# Patient Record
Sex: Female | Born: 1965 | Race: White | Hispanic: No | Smoking: Never smoker
Health system: Southern US, Community
[De-identification: ages and names within clinical notes are randomized; demographics above are authoritative.]

## PROBLEM LIST (undated history)

## (undated) DIAGNOSIS — Z9889 Other specified postprocedural states: Secondary | ICD-10-CM

## (undated) DIAGNOSIS — G43909 Migraine, unspecified, not intractable, without status migrainosus: Secondary | ICD-10-CM

## (undated) DIAGNOSIS — K760 Fatty (change of) liver, not elsewhere classified: Secondary | ICD-10-CM

## (undated) DIAGNOSIS — E2839 Other primary ovarian failure: Secondary | ICD-10-CM

## (undated) DIAGNOSIS — J40 Bronchitis, not specified as acute or chronic: Secondary | ICD-10-CM

## (undated) DIAGNOSIS — F419 Anxiety disorder, unspecified: Secondary | ICD-10-CM

## (undated) DIAGNOSIS — R112 Nausea with vomiting, unspecified: Secondary | ICD-10-CM

## (undated) DIAGNOSIS — F988 Other specified behavioral and emotional disorders with onset usually occurring in childhood and adolescence: Secondary | ICD-10-CM

## (undated) DIAGNOSIS — F32A Depression, unspecified: Secondary | ICD-10-CM

## (undated) DIAGNOSIS — I10 Essential (primary) hypertension: Secondary | ICD-10-CM

## (undated) DIAGNOSIS — F329 Major depressive disorder, single episode, unspecified: Secondary | ICD-10-CM

## (undated) DIAGNOSIS — K219 Gastro-esophageal reflux disease without esophagitis: Secondary | ICD-10-CM

## (undated) DIAGNOSIS — K589 Irritable bowel syndrome without diarrhea: Secondary | ICD-10-CM

## (undated) HISTORY — DX: Fatty (change of) liver, not elsewhere classified: K76.0

## (undated) HISTORY — DX: Migraine, unspecified, not intractable, without status migrainosus: G43.909

## (undated) HISTORY — PX: OTHER SURGICAL HISTORY: SHX169

## (undated) HISTORY — DX: Irritable bowel syndrome, unspecified: K58.9

## (undated) HISTORY — DX: Gastro-esophageal reflux disease without esophagitis: K21.9

## (undated) HISTORY — PX: ESOPHAGOGASTRODUODENOSCOPY: SHX1529

## (undated) HISTORY — PX: ABDOMINAL HYSTERECTOMY: SHX81

## (undated) HISTORY — DX: Depression, unspecified: F32.A

## (undated) HISTORY — PX: COLONOSCOPY: SHX174

## (undated) HISTORY — PX: TUBAL LIGATION: SHX77

## (undated) HISTORY — DX: Other primary ovarian failure: E28.39

## (undated) HISTORY — DX: Other specified behavioral and emotional disorders with onset usually occurring in childhood and adolescence: F98.8

## (undated) HISTORY — PX: TOTAL ABDOMINAL HYSTERECTOMY W/ BILATERAL SALPINGOOPHORECTOMY: SHX83

## (undated) HISTORY — PX: SPINAL FUSION: SHX223

---
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
## 2000-09-23 ENCOUNTER — Emergency Department (HOSPITAL_COMMUNITY): Admission: EM | Admit: 2000-09-23 | Discharge: 2000-09-23 | Payer: Self-pay | Admitting: Emergency Medicine

## 2000-12-26 ENCOUNTER — Emergency Department (HOSPITAL_COMMUNITY): Admission: EM | Admit: 2000-12-26 | Discharge: 2000-12-26 | Payer: Self-pay | Admitting: Internal Medicine

## 2001-06-09 ENCOUNTER — Ambulatory Visit (HOSPITAL_COMMUNITY): Admission: RE | Admit: 2001-06-09 | Discharge: 2001-06-09 | Payer: Self-pay | Admitting: Family Medicine

## 2001-06-09 ENCOUNTER — Encounter: Payer: Self-pay | Admitting: Family Medicine

## 2002-02-16 ENCOUNTER — Emergency Department (HOSPITAL_COMMUNITY): Admission: EM | Admit: 2002-02-16 | Discharge: 2002-02-16 | Payer: Self-pay | Admitting: Emergency Medicine

## 2002-03-05 HISTORY — PX: COLONOSCOPY WITH ESOPHAGOGASTRODUODENOSCOPY (EGD): SHX5779

## 2002-03-27 ENCOUNTER — Encounter: Payer: Self-pay | Admitting: Family Medicine

## 2002-03-27 ENCOUNTER — Ambulatory Visit (HOSPITAL_COMMUNITY): Admission: RE | Admit: 2002-03-27 | Discharge: 2002-03-27 | Payer: Self-pay | Admitting: Family Medicine

## 2002-04-24 ENCOUNTER — Ambulatory Visit (HOSPITAL_COMMUNITY): Admission: RE | Admit: 2002-04-24 | Discharge: 2002-04-24 | Payer: Self-pay | Admitting: Internal Medicine

## 2002-12-10 ENCOUNTER — Encounter: Payer: Self-pay | Admitting: Family Medicine

## 2002-12-10 ENCOUNTER — Ambulatory Visit (HOSPITAL_COMMUNITY): Admission: RE | Admit: 2002-12-10 | Discharge: 2002-12-10 | Payer: Self-pay | Admitting: Family Medicine

## 2002-12-30 ENCOUNTER — Ambulatory Visit (HOSPITAL_COMMUNITY): Admission: RE | Admit: 2002-12-30 | Discharge: 2002-12-30 | Payer: Self-pay | Admitting: Obstetrics and Gynecology

## 2003-01-26 ENCOUNTER — Inpatient Hospital Stay (HOSPITAL_COMMUNITY): Admission: RE | Admit: 2003-01-26 | Discharge: 2003-01-27 | Payer: Self-pay | Admitting: Obstetrics & Gynecology

## 2003-08-23 ENCOUNTER — Ambulatory Visit (HOSPITAL_COMMUNITY): Admission: RE | Admit: 2003-08-23 | Discharge: 2003-08-23 | Payer: Self-pay | Admitting: Orthopedic Surgery

## 2003-09-03 ENCOUNTER — Emergency Department (HOSPITAL_COMMUNITY): Admission: EM | Admit: 2003-09-03 | Discharge: 2003-09-03 | Payer: Self-pay | Admitting: Emergency Medicine

## 2003-11-03 ENCOUNTER — Ambulatory Visit (HOSPITAL_COMMUNITY): Admission: RE | Admit: 2003-11-03 | Discharge: 2003-11-03 | Payer: Self-pay | Admitting: Orthopedic Surgery

## 2003-11-05 ENCOUNTER — Ambulatory Visit: Payer: Self-pay | Admitting: Psychology

## 2003-11-09 ENCOUNTER — Ambulatory Visit (HOSPITAL_COMMUNITY): Admission: RE | Admit: 2003-11-09 | Discharge: 2003-11-09 | Payer: Self-pay | Admitting: Internal Medicine

## 2003-12-24 ENCOUNTER — Ambulatory Visit: Payer: Self-pay | Admitting: Psychiatry

## 2004-02-18 ENCOUNTER — Ambulatory Visit: Payer: Self-pay | Admitting: Psychiatry

## 2004-05-12 ENCOUNTER — Ambulatory Visit: Payer: Self-pay | Admitting: Psychiatry

## 2004-10-02 ENCOUNTER — Ambulatory Visit (HOSPITAL_COMMUNITY): Admission: RE | Admit: 2004-10-02 | Discharge: 2004-10-02 | Payer: Self-pay | Admitting: Family Medicine

## 2004-12-01 ENCOUNTER — Ambulatory Visit (HOSPITAL_COMMUNITY): Admission: RE | Admit: 2004-12-01 | Discharge: 2004-12-01 | Payer: Self-pay | Admitting: Family Medicine

## 2005-02-05 ENCOUNTER — Encounter (HOSPITAL_COMMUNITY): Admission: RE | Admit: 2005-02-05 | Discharge: 2005-02-22 | Payer: Self-pay | Admitting: Neurosurgery

## 2005-02-27 ENCOUNTER — Encounter (HOSPITAL_COMMUNITY): Admission: RE | Admit: 2005-02-27 | Discharge: 2005-02-27 | Payer: Self-pay | Admitting: Family Medicine

## 2005-03-07 ENCOUNTER — Encounter (HOSPITAL_COMMUNITY): Admission: RE | Admit: 2005-03-07 | Discharge: 2005-04-06 | Payer: Self-pay | Admitting: Family Medicine

## 2005-03-07 ENCOUNTER — Encounter (HOSPITAL_COMMUNITY): Admission: RE | Admit: 2005-03-07 | Discharge: 2005-04-06 | Payer: Self-pay | Admitting: Neurosurgery

## 2005-03-27 ENCOUNTER — Ambulatory Visit (HOSPITAL_COMMUNITY): Admission: RE | Admit: 2005-03-27 | Discharge: 2005-03-27 | Payer: Self-pay | Admitting: Otolaryngology

## 2005-05-05 ENCOUNTER — Emergency Department (HOSPITAL_COMMUNITY): Admission: EM | Admit: 2005-05-05 | Discharge: 2005-05-05 | Payer: Self-pay | Admitting: Emergency Medicine

## 2005-07-11 ENCOUNTER — Encounter (HOSPITAL_COMMUNITY): Admission: RE | Admit: 2005-07-11 | Discharge: 2005-08-10 | Payer: Self-pay | Admitting: Neurosurgery

## 2005-08-13 ENCOUNTER — Encounter (HOSPITAL_COMMUNITY): Admission: RE | Admit: 2005-08-13 | Discharge: 2005-09-12 | Payer: Self-pay | Admitting: Neurosurgery

## 2006-03-25 ENCOUNTER — Ambulatory Visit (HOSPITAL_COMMUNITY): Admission: RE | Admit: 2006-03-25 | Discharge: 2006-03-25 | Payer: Self-pay | Admitting: Neurosurgery

## 2006-07-24 ENCOUNTER — Ambulatory Visit (HOSPITAL_COMMUNITY): Admission: RE | Admit: 2006-07-24 | Discharge: 2006-07-25 | Payer: Self-pay | Admitting: Neurosurgery

## 2006-08-29 ENCOUNTER — Encounter (HOSPITAL_COMMUNITY): Admission: RE | Admit: 2006-08-29 | Discharge: 2006-09-28 | Payer: Self-pay | Admitting: Neurosurgery

## 2006-11-08 ENCOUNTER — Other Ambulatory Visit: Admission: RE | Admit: 2006-11-08 | Discharge: 2006-11-08 | Payer: Self-pay | Admitting: Obstetrics and Gynecology

## 2006-12-10 ENCOUNTER — Ambulatory Visit (HOSPITAL_COMMUNITY): Admission: RE | Admit: 2006-12-10 | Discharge: 2006-12-10 | Payer: Self-pay | Admitting: Obstetrics & Gynecology

## 2006-12-25 ENCOUNTER — Ambulatory Visit (HOSPITAL_COMMUNITY): Admission: RE | Admit: 2006-12-25 | Discharge: 2006-12-25 | Payer: Self-pay | Admitting: Obstetrics & Gynecology

## 2006-12-27 ENCOUNTER — Ambulatory Visit (HOSPITAL_COMMUNITY): Admission: RE | Admit: 2006-12-27 | Discharge: 2006-12-27 | Payer: Self-pay | Admitting: Neurosurgery

## 2007-02-11 ENCOUNTER — Ambulatory Visit (HOSPITAL_COMMUNITY): Admission: RE | Admit: 2007-02-11 | Discharge: 2007-02-11 | Payer: Self-pay | Admitting: Family Medicine

## 2007-03-04 ENCOUNTER — Emergency Department (HOSPITAL_COMMUNITY): Admission: EM | Admit: 2007-03-04 | Discharge: 2007-03-04 | Payer: Self-pay | Admitting: Emergency Medicine

## 2007-03-06 HISTORY — PX: ABDOMINAL HYSTERECTOMY: SHX81

## 2007-04-10 ENCOUNTER — Emergency Department (HOSPITAL_COMMUNITY): Admission: EM | Admit: 2007-04-10 | Discharge: 2007-04-10 | Payer: Self-pay | Admitting: Emergency Medicine

## 2007-08-19 ENCOUNTER — Ambulatory Visit: Payer: Self-pay | Admitting: Internal Medicine

## 2007-09-09 ENCOUNTER — Ambulatory Visit (HOSPITAL_COMMUNITY): Admission: RE | Admit: 2007-09-09 | Discharge: 2007-09-09 | Payer: Self-pay | Admitting: Family Medicine

## 2007-09-30 ENCOUNTER — Ambulatory Visit (HOSPITAL_COMMUNITY): Admission: RE | Admit: 2007-09-30 | Discharge: 2007-09-30 | Payer: Self-pay | Admitting: Family Medicine

## 2007-10-04 HISTORY — PX: COLONOSCOPY: SHX174

## 2007-10-07 ENCOUNTER — Ambulatory Visit: Payer: Self-pay | Admitting: Internal Medicine

## 2007-10-15 ENCOUNTER — Ambulatory Visit: Payer: Self-pay | Admitting: Internal Medicine

## 2007-10-15 ENCOUNTER — Ambulatory Visit (HOSPITAL_COMMUNITY): Admission: RE | Admit: 2007-10-15 | Discharge: 2007-10-15 | Payer: Self-pay | Admitting: Internal Medicine

## 2007-10-20 ENCOUNTER — Ambulatory Visit (HOSPITAL_COMMUNITY): Admission: RE | Admit: 2007-10-20 | Discharge: 2007-10-20 | Payer: Self-pay | Admitting: Family Medicine

## 2008-01-07 ENCOUNTER — Ambulatory Visit (HOSPITAL_COMMUNITY): Admission: RE | Admit: 2008-01-07 | Discharge: 2008-01-07 | Payer: Self-pay | Admitting: Family Medicine

## 2008-01-22 ENCOUNTER — Encounter: Admission: RE | Admit: 2008-01-22 | Discharge: 2008-01-22 | Payer: Self-pay | Admitting: Neurosurgery

## 2008-04-30 ENCOUNTER — Emergency Department (HOSPITAL_COMMUNITY): Admission: EM | Admit: 2008-04-30 | Discharge: 2008-04-30 | Payer: Self-pay | Admitting: Emergency Medicine

## 2009-03-10 ENCOUNTER — Ambulatory Visit (HOSPITAL_COMMUNITY): Admission: RE | Admit: 2009-03-10 | Discharge: 2009-03-10 | Payer: Self-pay | Admitting: Family Medicine

## 2009-05-09 ENCOUNTER — Ambulatory Visit (HOSPITAL_COMMUNITY)
Admission: RE | Admit: 2009-05-09 | Discharge: 2009-05-09 | Payer: Self-pay | Admitting: Physical Medicine and Rehabilitation

## 2009-06-15 ENCOUNTER — Emergency Department (HOSPITAL_COMMUNITY): Admission: EM | Admit: 2009-06-15 | Discharge: 2009-06-15 | Payer: Self-pay | Admitting: Emergency Medicine

## 2009-06-21 ENCOUNTER — Encounter (HOSPITAL_COMMUNITY): Admission: RE | Admit: 2009-06-21 | Discharge: 2009-07-21 | Payer: Self-pay | Admitting: Family Medicine

## 2009-09-21 ENCOUNTER — Ambulatory Visit: Payer: Self-pay | Admitting: Vascular Surgery

## 2009-09-27 ENCOUNTER — Encounter (INDEPENDENT_AMBULATORY_CARE_PROVIDER_SITE_OTHER): Payer: Self-pay | Admitting: *Deleted

## 2009-09-27 ENCOUNTER — Encounter: Payer: Self-pay | Admitting: Gastroenterology

## 2009-09-27 ENCOUNTER — Ambulatory Visit: Payer: Self-pay | Admitting: Gastroenterology

## 2009-09-27 DIAGNOSIS — R635 Abnormal weight gain: Secondary | ICD-10-CM | POA: Insufficient documentation

## 2009-09-27 DIAGNOSIS — Z8711 Personal history of peptic ulcer disease: Secondary | ICD-10-CM | POA: Insufficient documentation

## 2009-09-27 DIAGNOSIS — K5900 Constipation, unspecified: Secondary | ICD-10-CM | POA: Insufficient documentation

## 2009-09-27 DIAGNOSIS — R1013 Epigastric pain: Secondary | ICD-10-CM | POA: Insufficient documentation

## 2009-09-27 DIAGNOSIS — K589 Irritable bowel syndrome without diarrhea: Secondary | ICD-10-CM | POA: Insufficient documentation

## 2009-09-27 DIAGNOSIS — K219 Gastro-esophageal reflux disease without esophagitis: Secondary | ICD-10-CM | POA: Insufficient documentation

## 2009-09-28 ENCOUNTER — Encounter: Payer: Self-pay | Admitting: Gastroenterology

## 2009-10-03 ENCOUNTER — Encounter: Payer: Self-pay | Admitting: Gastroenterology

## 2009-10-03 DIAGNOSIS — R799 Abnormal finding of blood chemistry, unspecified: Secondary | ICD-10-CM

## 2009-10-06 LAB — CONVERTED CEMR LAB
ALT: 26 U/L
AST: 20 U/L
Albumin: 4.4 g/dL
Alkaline Phosphatase: 58 U/L
BUN: 15 mg/dL
Basophils Absolute: 0.1 10*3/uL
Basophils Relative: 1 %
CO2: 22 meq/L
Calcium: 9 mg/dL
Chloride: 107 meq/L
Creatinine, Ser: 0.82 mg/dL
Eosinophils Absolute: 0.2 10*3/uL
Eosinophils Relative: 3 %
Free T4: 0.95 ng/dL
Glucose, Bld: 117 mg/dL — ABNORMAL HIGH
HCT: 41 %
Hemoglobin: 13.6 g/dL
Lipase: 34 U/L
Lymphocytes Relative: 32 %
Lymphs Abs: 1.9 10*3/uL
MCHC: 33.2 g/dL
MCV: 91.5 fL
Monocytes Absolute: 0.4 10*3/uL
Monocytes Relative: 7 %
Neutro Abs: 3.4 10*3/uL
Neutrophils Relative %: 58 %
Platelets: 241 10*3/uL
Potassium: 5.4 meq/L — ABNORMAL HIGH
RBC: 4.48 M/uL
RDW: 13.9 %
Sodium: 140 meq/L
TSH: 1.609 u[IU]/mL
Total Bilirubin: 0.5 mg/dL
Total Protein: 7 g/dL
WBC: 5.9 10*3/uL

## 2009-10-10 ENCOUNTER — Encounter (INDEPENDENT_AMBULATORY_CARE_PROVIDER_SITE_OTHER): Payer: Self-pay

## 2009-10-10 LAB — CONVERTED CEMR LAB
CO2: 26 meq/L (ref 19–32)
Calcium: 9 mg/dL (ref 8.4–10.5)
Creatinine, Ser: 0.78 mg/dL (ref 0.40–1.20)

## 2009-12-21 ENCOUNTER — Encounter (INDEPENDENT_AMBULATORY_CARE_PROVIDER_SITE_OTHER): Payer: Self-pay | Admitting: *Deleted

## 2009-12-29 ENCOUNTER — Ambulatory Visit (HOSPITAL_COMMUNITY): Admission: RE | Admit: 2009-12-29 | Discharge: 2009-12-29 | Payer: Self-pay | Admitting: Family Medicine

## 2010-01-10 ENCOUNTER — Ambulatory Visit (HOSPITAL_COMMUNITY): Admission: RE | Admit: 2010-01-10 | Discharge: 2010-01-10 | Payer: Self-pay | Admitting: Family Medicine

## 2010-03-21 ENCOUNTER — Emergency Department (HOSPITAL_COMMUNITY)
Admission: EM | Admit: 2010-03-21 | Discharge: 2010-03-21 | Payer: Self-pay | Source: Home / Self Care | Admitting: Emergency Medicine

## 2010-03-26 ENCOUNTER — Encounter: Payer: Self-pay | Admitting: Neurosurgery

## 2010-04-04 NOTE — Miscellaneous (Signed)
Summary: Orders Update  Clinical Lists Changes  Problems: Added new problem of BLOOD CHEMISTRY, ABNORMAL (ICD-790.99) Orders: Added new Test order of T-Basic Metabolic Panel [id number]) - Signed

## 2010-04-04 NOTE — Assessment & Plan Note (Signed)
Summary: IBS/SS   Visit Type:  Consult Referring Provider:  Lubertha South Primary Care Provider:  Lubertha South  Chief Complaint:  ibs and incontinence.  History of Present Illness: Mackenzie Gray is a pleasant 45 y/o WF, patient of Dr. Lubertha South, who presents for further evaluation of IBS symptoms. She was last seen in 8/09 at time of TCS/TI, which was normal except for internal hemorrhoids. She states over the last two years her symptoms have been persistent. She has alternating constipation and diarrhea. Generally has diarrhea for one week every 1-2 months. This is associated with abd cramps, urgency, and fecal incontinence on two occasions. Imodium doesn't help. At other times, she can go up to a week or more without BM. Has occasional brbpr at times with hard stools. She has bloating. Trigger foods for diarrhea are usually spicey foods, [place] and [place] foods. Sometimes happens with salads. She c/o heartburn frequently but takes tbsp of yellow mustard with relief. She does not like to take meds, but will take Pepcid sometimes. She has had some swallowing problems since her neck surgery. She has been trying to loose weight for two years but has gained 12 pounds.       Current Medications (verified): 1)  None  Allergies: 1)  ! Codeine 2)  ! Morphine  Past History:  Past Medical History: Adult ADD Bipolar Disorder IBS Migraines GERD TCS, 8/09-->internal hemorrhoids, normal colon and terminal ileum EGD, 2004, tiny prepyloric ulcer, bx negative Fatty liver on Abd U/S, 2010  Past Surgical History: Herniated Disc, cervical, 2008 BTL Left oophorectomy/hysterectomy Cervical cryosurgery  Family History: Father, CVA, COPD, PUD Grandmother, lung cancer Maternal grandfather, colon cancer Sister, Crohn's Mother, CVA, MI-3 stents, DM  Social History: Four children. Single. 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 Never smoked. Rare alcohol. No drugs.   Review of  Systems General:  Denies fever, chills, sweats, anorexia, fatigue, weakness, and weight loss. Eyes:  Denies vision loss. ENT:  Complains of difficulty swallowing; denies nasal congestion, sore throat, and hoarseness. CV:  Denies chest pains, angina, palpitations, dyspnea on exertion, and peripheral edema. Resp:  Denies dyspnea at rest, dyspnea with exercise, cough, sputum, and wheezing. GI:  See HPI. GU:  Denies urinary burning and blood in urine. MS:  Complains of joint pain / LOM. Derm:  Denies rash and itching. Neuro:  Denies weakness, frequent headaches, memory loss, and confusion. Psych:  Denies depression and anxiety. Endo:  Denies unusual weight change. Heme:  Denies bruising and bleeding. Allergy:  Denies hives and rash.  Vital Signs:  Patient profile:   45 year old female Height:      64 inches Weight:      198 pounds BMI:     34.11 Temp:     98.1 degrees F oral Pulse rate:   72 / minute BP sitting:   122 / 88  (left arm) Cuff size:   regular  Vitals Entered By: Mackenzie Gray (September 27, 2009 8:43 AM)  Physical Exam  General:  Well developed, well nourished, no acute distress.obese.   Head:  Normocephalic and atraumatic. Eyes:  Conjunctivae pink, no scleral icterus.  Mouth:  Oropharyngeal mucosa moist, pink.  No lesions, erythema or exudate.    Neck:  Supple; no masses or thyromegaly. Lungs:  Clear throughout to auscultation. Heart:  Regular rate and rhythm; no murmurs, rubs,  or bruits. Abdomen:  Mild epig tenderness. Normal BS. Soft. Obese. No HSM or masses. No abd bruit or hernia. No rebound or  guarding.  Extremities:  No clubbing, cyanosis, edema or deformities noted. Neurologic:  Alert and  oriented x4;  grossly normal neurologically. Skin:  Intact without significant lesions or rashes. Cervical Nodes:  No significant cervical adenopathy. Psych:  Alert and cooperative. Normal mood and affect.  Impression & Recommendations:  Problem # 1:  IBS  (ICD-564.1)  IBS with alternating constipation and diarrhea. Constipation predominant. Add probiotics, Align one daily for four weeks. Samples provided. Add Miralax. IBS literature given. She should avoid trigger foods. Stick to low-fat and high fiber diet. Will check labs. No need for further w/u at this time. OV with Dr. Jena Gauss in six weeks. Add Hyomax for diarrhea and abd cramps.  Orders: Consultation Level III (54098)  Problem # 2:  GERD (ICD-530.81)  GERD literature provided. Start Prilosec 20mg  by mouth daily, #7 samples provided. Take 30 mins before breakfast daily. RX given.   Orders: Consultation Level III (11914)  Problem # 3:  EPIGASTRIC PAIN (ICD-789.06) H/O PUD in past. Add PPI. Check labs. Reevaluate in 6 weeks.  Orders: T-Lipase [id number]) T-Comprehensive Metabolic Panel [phone]) T-CBC w/Diff [phone]) Consultation Level III [id number])  Problem # 4:  WEIGHT GAIN (ICD-783.1) Likely related to inadequate excercise and dietary consumption. Will check thyroid status.  Orders: T-TSH [id number]) T-Free T4 (23300) T-Comprehensive Metabolic Panel ([id number]) Consultation Level III (56433)  Patient Instructions: 1)  Please start Miralax 17 grams by mouth daily until regular soft BM, then you can back down to 3-4 times per week. You may continue daily if needed. 2)  Align one by mouth daily for four weeks. (probiotic) 3)  Prilosec, one by mouth 30 mins before breakfast daily for GERD, epigastric pain. We have provided you with samples to get started and RX. 4)  Hyomax SL, to take up to four times per day for diarrhea and abd cramps. Regular use may cause constipation or dry mouth. Advise to use as needed only. 5)  See IBS/GERD literature. 6)  Maintain low-fat, high fiber diet.  7)  Have labwork done. 8)  OV in six weeks.  9)  The medication list was reviewed and reconciled.  All changed / newly prescribed medications were explained.  A complete medication list  was provided to the patient / caregiver. Prescriptions: HYOMAX-SL 0.125 MG SUBL (HYOSCYAMINE SULFATE) one SL up to four times per day for diarrhea and cramps prn  #120 x 3   Entered and Authorized by:   Leanna Battles. Dixon Boos   Signed by:   Leanna Battles Magen Suriano PA-C on 09/27/2009   Method used:   Print then Give to Patient   RxID:   [id number] POLYETHYLENE GLYCOL 3350  POWD (POLYETHYLENE GLYCOL 3350) 17 grams by mouth daily until regular BM and then continue at least 3-4 times per week.  #527 grams x 5   Entered and Authorized by:   Leanna Battles. Dixon Boos   Signed by:   Leanna Battles River Mckercher PA-C on 09/27/2009   Method used:   Print then Give to Patient   RxID:   [id number] OMEPRAZOLE 20 MG CPDR (OMEPRAZOLE) one by mouth 30 mins before breakfast daily  #30 x 5   Entered and Authorized by:   Leanna Battles. Dixon Boos   Signed by:   Leanna Battles Shaila Gilchrest PA-C on 09/27/2009   Method used:   Print then Give to Patient   RxID:   [id number]  I would like to thank Dr. Lubertha South for allowing [place] to take part in the care of  this nice patient.  Appended Document: IBS/SS FOLLOW UP APPOINTMENT IS IN THE COMPUTER  Appended Document: IBS/SS Appt already made with RMR. Does not need NIC.

## 2010-04-04 NOTE — Letter (Signed)
Summary: REFERRAL FROM DR. STEVE LUKING  REFERRAL FROM DR. STEVE LUKING   Imported By: Rexene Alberts 09/27/2009 16:35:48  _____________________________________________________________________  External Attachment:    Type:   Image     Comment:   External Document

## 2010-04-04 NOTE — Letter (Signed)
Summary: Scheduled Appointment  [facility]  [address]   [place], [place] [address]   Phone: [phone]  Fax: [phone]    September 27, 2009   Dear: Mackenzie Gray            DOB: 12/17/1965    I have been instructed to schedule you an appointment in our office.  Your appointment is as follows:   Date:       November 08, 2009   Time:      8:30AM     Please be here 15 minutes early.   Provider:  DR Jena Gauss    Please contact the office if you need to reschedule this appointment for a more convenient time.   Thank you,    Diana Eves       [facility] Ph: [phone]   Fax: [phone]

## 2010-04-04 NOTE — Letter (Signed)
Summary: Normal Results Letter  [facility]  [address]   [place], [place] [address]   Phone: [phone]  Fax: [phone]    October 10, 2009  Mackenzie Gray [address] [place], [place]  [address] 10-Jun-1965   Dear Ms. Garrels,   Our office has been trying to contact you.  We just wanted to inform you that  your lab Met-7 was normal. Please keep office visit appt with Dr. Jena Gauss as scheduled. Please call if you have any questions, [phone].   Thank you,    Hendricks Limes, LPN Cloria Spring, LPN  [facility] Ph: [phone]   Fax: [phone]

## 2010-04-04 NOTE — Letter (Signed)
Summary: Recall Office Visit  [facility]  [address]   [place], [place] [address]   Phone: [phone]  Fax: [phone]      December 21, 2009   Mackenzie Gray [address] [place], [place]  [address] [birth year]   Dear [name],   According to our records, it is time for you to schedule a follow-up office visit with [place].   At your convenience, please call [phone] to schedule an office visit. If you have any questions, concerns, or feel that this letter is in error, we would appreciate your call.   Sincerely,    Diana Eves  [facility] Ph: [phone]   Fax: [phone]

## 2010-06-20 LAB — CBC
Hemoglobin: 14.6 g/dL (ref 12.0–15.0)
RDW: 13 % (ref 11.5–15.5)
WBC: 8.6 10*3/uL (ref 4.0–10.5)

## 2010-06-20 LAB — URINALYSIS, ROUTINE W REFLEX MICROSCOPIC
Bilirubin Urine: NEGATIVE
Glucose, UA: NEGATIVE mg/dL
Hgb urine dipstick: NEGATIVE
Ketones, ur: NEGATIVE mg/dL
Protein, ur: NEGATIVE mg/dL

## 2010-06-20 LAB — COMPREHENSIVE METABOLIC PANEL
ALT: 25 U/L (ref 0–35)
Albumin: 4.2 g/dL (ref 3.5–5.2)
Alkaline Phosphatase: 73 U/L (ref 39–117)
Glucose, Bld: 89 mg/dL (ref 70–99)
Potassium: 4.3 mEq/L (ref 3.5–5.1)
Sodium: 139 mEq/L (ref 135–145)
Total Protein: 7.3 g/dL (ref 6.0–8.3)

## 2010-06-20 LAB — DIFFERENTIAL
Basophils Relative: 0 % (ref 0–1)
Eosinophils Absolute: 1.1 10*3/uL — ABNORMAL HIGH (ref 0.0–0.7)
Monocytes Absolute: 0.5 10*3/uL (ref 0.1–1.0)
Monocytes Relative: 6 % (ref 3–12)
Neutrophils Relative %: 63 % (ref 43–77)

## 2010-07-18 NOTE — Assessment & Plan Note (Signed)
NAMEMAVERY, MILLING                  CHART#:  [id number]   DATE:                                   DOB:  02-12-1966   REQUESTING PHYSICIAN:  [name], MD   REASON FOR CONSULTATION:  Rectal bleeding and gastritis.   HISTORY OF PRESENT ILLNESS:  The patient is a 45 year old Caucasian  female.  She is here for further evaluation of multiple GI concerns.  The first being a one-month history of intermittent small-to-large  volume bright red rectal bleeding.  She has noticed blood in the stool  as well as on the toilet paper as well as blood in mucus along with  watery diarrhea at times.  Symptoms have progressively gotten worse over  the last month.  She tells me she is having a lot of lower abdominal  pain bilaterally.  She tells me that she has nausea and has vomited on 3  occasions over the last month as well.  She has also had heartburn and  indigestion.  She tells me that she has significant amount of abdominal  bloating.  She has had some anorexia, and then at other times, she seems  to gorge food, which then makes her feel worse.  She has alternating  constipation and diarrhea.  At times, her stools are so hard that they  hurt to pass, and at other times, she is having anywhere from 5-10  loose watery stools per day.  She usually has diarrhea about 3 days per  week.  Her weight has steadily increased over the past year.   PAST MEDICAL AND SURGICAL HISTORY:  She had a prepyloric ulcer diagnosed  on EGD by Dr. Jena Gauss on 04/24/2002.  She had anal canal internal  hemorrhoids seen on colonoscopy at the same time.  She has history of  IBS.  She has history of tubal ligation, partial hysterectomy, ruptured  cervical disk,  status post cervical disk surgery.   CURRENT MEDICATIONS:  Denies any.   ALLERGIES:  Codeine.   FAMILY HISTORY:  Positive for maternal grandfather with colon cancer and  a sister with Crohn's disease.  Mother is alive, has history of CVA, MI,  and diabetes  mellitus.  Father has COPD, CVA, GERD, and peptic ulcer  disease.  One healthy brother and sister.   SOCIAL HISTORY:  The patient is single.  She has 4 children; ages 58, 25,  61, and 6.  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  She has recently  been unemployed.  She denies any tobacco, alcohol, or drug use.   REVIEW OF SYSTEMS:  See HPI, otherwise negative.   PHYSICAL EXAMINATION:  VITAL SIGNS:  Weight 186 pounds and height 64  inches, temp 98.5, blood pressure 112/78, and pulse 80.  GENERAL:  The patient is an obese Caucasian female, who is alert,  oriented, pleasant, cooperative, in no acute distress.  HEENT:  Sclerae clear. Nonicteric. Conjunctivae pink.  Oropharynx pink  and moist without any lesions.  NECK:  Supple without mass or thyromegaly.  CHEST:  Heart, regular rate and rhythm.  Normal S1 and S2 without  murmurs, clicks, rubs, or gallops.  LUNGS:  Clear to auscultation bilaterally.  ABDOMEN:  Positive bowel sounds x4.  No bruits auscultated.  She does  have moderate tenderness to left lower quadrant on deep palpation as  well as suprapubic area and around the umbilicus.  There is no rebound  tenderness or guarding.  No hepatosplenomegaly or mass, although exam is  limited given the patient's body habitus.  RECTAL:  She does have multiple external hemorrhoids visualized as well  as internal hemorrhoids.  Stool was Hemoccult and not.  Small amount of  clear secretions from the bowel were Hemoccult positive.  There was no  internal mass palpated.  EXTREMITIES:  Without clubbing or edema bilaterally.   LABORATORY STUDIES:  From Dr. Fletcher Anon office, she had a CBC on  02/10/2007, which was normal.  Normal MET-7 and LFTs.  She had a CT of  the abdomen with contrast on 02/11/2007, which showed minimal descending  colonic diverticulosis and was otherwise normal.   IMPRESSION:  The patient is a 45 year old  Caucasian female with history  of IBS and hemorrhoids who has had alternating constipation, diarrhea,  nausea, vomiting, heartburn, and indigestion.  She also has history of  peptic ulcer disease 5 years ago.  She has not been on PPI.  She is  found to have internal and external hemorrhoids on exam today.  We will  treat her for IBS with benign anorectal bleeding.  However, if she does  not improve with standard treatment, she may require further evaluation.  She has chronic GERD symptoms off PPI.   PLAN:  1. IBS and home orders were given for review.  2. Levsin 0.125 mg a.c. and h.s., #60 with 1 refill.  3. Omeprazole 20 mg daily, #31 with 1 refill.  4. Anusol-HC suppositories 1 per rectum b.i.d., #20 with no refills.  5. Fiber supplements of choice.  I have given her samples of Benefiber      and Metamucil today.  6. Office visit in 6 weeks to assess her progress with Dr. Jena Gauss.  She      will call in the interim if symptoms worsen.   Thank you Dr. Gerda Diss for allowing [place] to participate in the care of this  patient.       Lorenza Burton, N.P.  Electronically Signed     R. Roetta Sessions, M.D.  Electronically Signed    KJ/MEDQ  D:  08/19/2007  T:  08/19/2007  Job:  [id number]   cc:   Lorin Picket A. Gerda Diss, MD

## 2010-07-18 NOTE — Procedures (Signed)
LOWER EXTREMITY VENOUS REFLUX EXAM   INDICATION:  Varicose veins with leg pain.   EXAM:  Using color-flow imaging and pulse Doppler spectral analysis, the  right and left common femoral, superficial femoral, popliteal, posterior  tibial, greater and lesser saphenous veins are evaluated.  There is no  evidence suggesting deep venous insufficiency in the right or left lower  extremity.   The right and left saphenofemoral junctions are competent.  The right  and left GSV is competent.   The right and left proximal short saphenous veins demonstrate  competency.   GSV Diameter (used if found to be incompetent only)                                            Right       Left  Proximal Greater Saphenous Vein           cm          cm  Proximal-to-mid-thigh                     cm          cm  Mid thigh                                 cm          cm  Mid-distal thigh                          cm          cm  Distal thigh                              cm          cm  Knee                                      cm          cm   IMPRESSION:  1. The right and left greater saphenous veins are competent.  2. The right and left greater saphenous veins are not aneurysmal.  3. The right and left greater saphenous veins are not tortuous.  4. The deep venous system is competent.  5. The right and left lesser saphenous veins are competent.   ___________________________________________  Larina Earthly, M.D.   NT/MEDQ  D:  09/21/2009  T:  09/21/2009  Job:  [id number]

## 2010-07-18 NOTE — Consult Note (Signed)
NEW PATIENT CONSULTATION   Mackenzie Gray, Mackenzie Gray  DOB:  1965/07/02                                       09/21/2009  ZOXWR#:[id number]   The patient presents today for evaluation of lower extremity discomfort.  She is a 45 year old white female with progressive difficulty in her  lower extremities.  She has several components of the discomfort.  She  reports achy sensation with prolonged standing.  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  She also has discomfort in the popliteal fossa where  she does have some spider vein telangiectasia with an itching sensation  over this area as well.  She does have some chronic swelling in her  lower extremities.  She does not have a history of DVT or superficial  thrombophlebitis.  No history of varicosities.   PAST MEDICAL HISTORY:  Negative for hypertension and cardiac disease.  She does have history of prior cervical disk surgery.   FAMILY HISTORY:  Significant for premature atherosclerotic disease in  her mother.   SOCIAL HISTORY:  She has four children.  She does not smoke or drink  alcohol.   PHYSICAL EXAM:  Well-developed, moderately obese white female in no  acute stress.  VITAL SIGNS:  Blood pressure is 104/76, pulse 72, respirations 18.  HEENT:  Normal.  She does have palpable dorsalis pedis and posterior  tibial pulses bilaterally.  MUSCULOSKELETAL:  Shows no major deformities or cyanosis.  NEUROLOGIC:  No focal weakness or paresthesias.  SKIN:  Without ulcers or rashes.  She does have a few small scattered  telangiectasia mostly in the popliteal fossa bilaterally.   She underwent noninvasive vascular laboratory studies which I reviewed,  ordered and interpreted.  This shows no evidence of DVT.  No evidence of  deep venous reflux and no evidence of surface vein reflux bilaterally.  I discussed this with the patient, explained that I do not see  any  evidence of significant arterial or venous pathology which could explain  her symptoms.  She is concerned regarding the itching in her posterior  popliteal space around the level of telangiectasia.  I explained that  this may or may not be due to the telangiectasia, but these could be  treated with sclerotherapy if this becomes more significant concern.  She understands and will see [place] on an as-needed basis.  She was  reassured that there is no arterial or venous pathology to explain her  symptoms.     Larina Earthly, M.Gray.  Electronically Signed   TFE/MEDQ  Gray:  09/21/2009  T:  09/22/2009  Job:  [id number]   cc:   Lorin Picket A. Gerda Diss, MD

## 2010-07-18 NOTE — Op Note (Signed)
NAME:  Mackenzie Gray, Mackenzie Gray                 ACCOUNT NO.:  [id number]   MEDICAL RECORD NO.:  [id number]          PATIENT TYPE:  AMB   LOCATION:  DAY                           FACILITY:  [facility]   PHYSICIAN:  R. Roetta Sessions, M.D. DATE OF BIRTH:  12-Sep-1965   DATE OF PROCEDURE:  10/15/2007  DATE OF DISCHARGE:                               OPERATIVE REPORT   INDICATIONS FOR PROCEDURE:  A 45 year old lady with intermittent  hematochezia.  Symptoms have been ongoing for 5 years.  She had a  colonoscopy in 2004 without significant findings.  Rectal bleeding has  failed to resolve with conservative measures.  Suspect ongoing issues  related to hemorrhoids.  Colonoscopy is now being done to further  evaluate rectal bleeding.  Risks, benefits, alternatives, and  limitations have been reviewed with Ms. Hoaglund at length, questions  answered.  She is agreeable.  Please see the documentation medical  record.   PROCEDURE NOTE:  O2 saturation, blood pressure, pulse, respirations  monitored throughout the entire procedure.  Conscious sedation, Versed 6  mg IV, Demerol 75 mg IV in divided doses.   INSTRUMENT:  Pentax video chip system.   FINDINGS:  Digital rectal exam revealed no abnormalities.   ENDOSCOPIC FINDINGS:  Prep was adequate.  Colon:  Colonic mucosa was  surveyed from the rectosigmoid junction through the left transverse,  right colon to appendiceal orifice, ileocecal valve, and cecum.  These  structures were well seen and photographed for record.  Terminal ileum  was intubated 5 cm from this level.  Scope was slowly and cautiously  withdrawn.  All previous mentioned mucosal surfaces were again seen.  The colonic mucosa as did the terminal ileum mucosa appear normal.  The  scope was pulled down to the rectum where a thorough examination of the  rectal mucosa including retroflex view of anal verge demonstrated no  abnormalities aside from internal hemorrhoids.  The patient tolerated  the procedure  well and was reacted in Endoscopy.   IMPRESSION:  Internal hemorrhoids, otherwise normal rectum, colon, and  terminal ileum.   RECOMMENDATIONS:  Since Ms. Sassone has failed conservative measures in  combating bleeding hemorrhoids, we will go ahead and make her a surgical  referral to see Dr. Lovell Sheehan for more definitive surgical management.      Jonathon Bellows, M.D.  Electronically Signed    RMR/MEDQ  D:  10/15/2007  T:  10/16/2007  Job:  [id number]   cc:   Lorin Picket A. Gerda Diss, MD  Fax: [phone]

## 2010-07-18 NOTE — H&P (Signed)
NAME:  Mackenzie, Gray                 ACCOUNT NO.:  [id number]   MEDICAL RECORD NO.:  [id number]         PATIENT TYPE:  PAMB   LOCATION:                                FACILITY:  [facility]   PHYSICIAN:  R. Roetta Sessions, M.D. DATE OF BIRTH:  [birth year]   DATE OF ADMISSION:  DATE OF DISCHARGE:                              HISTORY & PHYSICAL   CHIEF COMPLAINT:  Hematochezia/hemorrhoids.   HISTORY OF PRESENT ILLNESS:  Mackenzie Gray. Foulk is a pleasant 45-year-  old Caucasian female followed primarily by Dr. Lilyan Punt, who comes  to see me with a complaint of recurrent persistent blood per rectum.  She generally has 2-3 bowel movements daily, occasionally goes a day  without a bowel movement, and has intermittent episodes of bright red  blood per rectum with bowel movements coloring the toilet water or  seeing blood on the toilet tissue.  If she eats things like salads or  fruit, she goes more often, and has more problems with bleeding.  These  symptoms go back to at least 2004, and back in 2004, she underwent a  colonoscopy, which demonstrated anal canal hemorrhoids only.  Also, she  had a tiny prepyloric ulcer (biopsies negative) back in February 2004 on  EGD.  She has occasional reflux symptoms 1 or 2 times monthly for which  she takes Prevacid 30 mg capsule with eventual relief.  She has not had  any early satiety, nausea, vomiting, dysphagia, or odynophagia.  Her  weight has been stable over the years, last few years.  She is not  taking nonsteroidal agents.  We saw this lady for alternating  constipation and diarrhea back in June 2009.  We tried her on some  Levsin, FiberChoice, and Anusol suppositories, all of which did not  really make much difference in the way of treating her symptoms.  She is  taking Prevacid in lieu of omeprazole, which was prescribed daily.  Ms.  Hirschhorn tells me that Dr. Gerda Diss has found a 1-cm thyroid nodule on a  recent scan, which is being worked up further.   She is very much concerned about her persisting rectal bleeding.  I note  in December 2008, she had some left-sided abdominal pain for which Dr.  Gerda Diss obtained a CT scan, which demonstrated minimal descending colon  diverticulosis and no other abnormalities.  Also, CBC, LFTs, and BMET  came back normal at that time.  I do have some notes from Dr. Gerda Diss  from spring that indicate [name] was having some abdominal pain  and had been on Relafen, which was stopped.   Currently, Ms. Manninen again denies any nonsteroidals and denies having  any abdominal pain.   PAST MEDICAL HISTORY:  1. Significant for tiny prepyloric ulcer diagnosed on EGD previously.  2. Anal canal hemorrhoids seen on colonoscopy previously.  3. History of IBS.  4. Status post tubal ligation.  5. Partial hysterectomy.  6. Cervical spine surgery.   CURRENT MEDICATIONS:  Estrace, Prevacid p.r.n., Anusol-HC suppository  p.r.n., Benefiber every other day, Levsin currently not taking.  ALLERGIES:  CODEINE and MORPHINE.   FAMILY HISTORY:  Maternal grandfather with colon cancer.  Sister with  Crohn disease.  Mother has history of CVA, MI, and diabetes.  Father  with COPD, CVA, GERD, and peptic ulcer disease.   SOCIAL HISTORY:  The patient is single.  She has four children.  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   REVIEW OF SYSTEMS:  Has not had any chest pain, dyspnea on exertion,  fever, or chills.   PHYSICAL EXAMINATION:  GENERAL:  A 45 year old lady resting comfortably.  VITAL SIGNS:  Weight 186, height 5 feet 4 inches, temperature 98.5,  blood pressure 140/90, and pulse 88.  SKIN:  Warm and dry.  HEENT:  No scleral icterus.  Conjunctivae are pink.  CHEST:  Lungs are clear to auscultation.  CARDIAC:  Regular rate and rhythm without murmur, gallop, or rub.  ABDOMEN:  Nondistended.  Positive bowel sounds.  Soft and nontender  without  appreciable masses or organomegaly.  EXTREMITIES:  No edema.  RECTAL:  Deferred for colonoscopy.   IMPRESSION:  Mackenzie Gray is a pleasant 45 year old lady with  intermittent hematochezia with periods of diarrhea depending on dietary  habits.   Symptoms have been ongoing for good 5 years.  Findings of colonoscopy in  2004 reassuring.  She has failed to improve as far her rectal bleeding  goes with conservative measures and I suspect she has ongoing issues  with hemorrhoids.  Having said that, however, it has been 5 years since  her lower GI tract has been evaluated, and moreover, Mackenzie Gray is  wondering about the possibility of something else going on in causing  her bleeding.  I have told Ms. Harner we ought to go ahead and recheck  her rectum and colon via colonoscopy.  Risks, benefits, alternatives,  and limitations have been reviewed.  Questions were answered.  We will  plan to perform a colonoscopy in the very near future to make further  recommendations regarding rectal bleeding.  She may ultimately need a  surgical procedure.  Further recommendations to follow.      Jonathon Bellows, M.D.  Electronically Signed     RMR/MEDQ  D:  10/07/2007  T:  10/07/2007  Job:  [id number]   cc:   Lorin Picket A. Gerda Diss, MD  Fax: [phone]

## 2010-07-21 NOTE — Op Note (Signed)
NAME:  Mackenzie Gray, Mackenzie Gray                       ACCOUNT NO.:  [id number]   MEDICAL RECORD NO.:  [id number]                   PATIENT TYPE:  AMB   LOCATION:  DAY                                  FACILITY:  [facility]   PHYSICIAN:  R. Roetta Sessions, M.D.              DATE OF BIRTH:  19-Oct-1965   DATE OF PROCEDURE:  04/24/2002  DATE OF DISCHARGE:                                 OPERATIVE REPORT   PROCEDURE:  Esophagogastroduodenoscopy with biopsy followed by diagnostic  colonoscopy.   INDICATIONS FOR PROCEDURE:  A 45 year old lady with early satiety, reflux  symptoms, epigastric pain and also has had some intermittent diarrhea and  rectal bleeding. EGD and colonoscopy are now being done to further evaluate  her symptoms. This approach has been discussed with the patient and the  potential risks, benefits, and alternatives have been reviewed. See my  office on April 10, 2002, CBC, sed rate, MET 7, liver profile, amylase,  lipase all within normal limits. Prior CT of the abdomen and pelvis  demonstrated a possible left dermoid cyst, some possible thickening of the  gastric cardia site suspicious for pancreatitis. Please see my dictated H&P  April 10, 2002 for more information.   MONITORING:  O2 saturation, blood pressure, pulse, and respirations were  monitored throughout the entirety of both procedures.   CONSCIOUS SEDATION:  Versed 6 mg IV, Demerol 125 mg IV in divided doses.  Cetacaine spray for topical oropharyngeal anesthesia.   INSTRUMENTS:  Olympus video chip gastroscope and colonoscope.   EGD FINDINGS:  Examination of the tubular esophagus revealed no mucosal  abnormality. The EG junction was easily traversed.   STOMACH:  The gastric cavity was empty and insufflated well with air. A  thorough examination of the gastric mucosa including a retroflexed view of  the proximal stomach and esophagogastric junction demonstrated a deep 5 mm  ulcer and a prepyloric mucosa with heaped  up donut ring shaped mucosa.  Please see photos.  The remainder of the gastric mucosa appeared normal. The  pylorus was patent and easily traversed.   DUODENUM:  The bulb and second portion appeared normal.   THERAPEUTIC/DIAGNOSTIC MANEUVERS:  The abnormality noted in the antrum was  biopsied multiple times for histologic study.   The patient tolerated the procedure well and was prepared for colonoscopy.   Digital rectal examination revealed no abnormalities.   ENDOSCOPIC FINDINGS:  The prep was adequate.   RECTUM:  Examination of the rectal mucosa including retroflexed view of the  anal verge revealed only some anal canal internal hemorrhoids.   COLON:  The colonic mucosa was surveyed from the rectosigmoid junction  through the left transverse right colon to the area of the appendiceal  orifice, ileocecal valve and cecum. These structures were well seen and  photographed for the record. The colonic mucosa  to the cecum appeared  normal. The terminal ileum was intubated a good 20 cm and  this segment of GI  tract also appeared normal. From the level of the cecum and ileocecal valve,  the scope was slowly withdrawn. All previously mentioned mucosal surfaces  were again seen and no other abnormalities were observed. The patient  tolerated the procedure well and was reacted in endoscopy.   IMPRESSION:  1. EGD. Normal esophagus.  2. Small, somewhat unusual shaped gastric ulcer. The gastric mucosa appeared     normal. Normal D1, D2. Ulcer biopsied.  3. Colonoscopy findings.  Anal canal/internal hemorrhoids, otherwise, normal     rectum, colon and terminal ileum.   RECOMMENDATIONS:  1. Begin Nexium 40 mg orally daily.  2. Check H. pylori serologies today.  3. 10 day course of Anusol-HC  suppositories one per rectum at bedtime.  4. Followup on path.  5. Further recommendations to follow.                                                Jonathon Bellows, M.D.    RMR/MEDQ  D:   04/24/2002  T:  04/24/2002  Job:  [id number]   cc:   Lorin Picket A. Gerda Diss, M.D.  [address]  [place]  [place] [address]  Fax: [phone]

## 2010-07-21 NOTE — Op Note (Signed)
NAME:  ALA, KRATZ                           ACCOUNT NO.:  [id number]   MEDICAL RECORD NO.:  [id number]                   PATIENT TYPE:  AMB   LOCATION:  DAY                                  FACILITY:  [facility]   PHYSICIAN:  Lazaro Arms, M.D.                DATE OF BIRTH:  07-28-1965   DATE OF PROCEDURE:  DATE OF DISCHARGE:                                 OPERATIVE REPORT   PREOPERATIVE DIAGNOSES:  1. Left dermoid cyst.  2. Menometrorrhagia.  3. Dysmenorrhea.   POSTOPERATIVE DIAGNOSES:  1. Left dermoid cyst.  2. Menometrorrhagia.  3. Dysmenorrhea.   PROCEDURE:  1. Total abdominal hysterectomy.  2. Left salpingo-oophorectomy   SURGEON:  Lazaro Arms, M.D.   ANESTHESIA:  General endotracheal   FINDINGS:  The patient was known to have a left dermoid cyst.  She also had  very heavy bleeding, very crampy, monthly and desired to have definitive  therapy.  She is status post tubal ligation.   DESCRIPTION OF PROCEDURE:  The patient was taken to the operating room and  placed in the supine position where she underwent general endotracheal  anesthesia.  Her vagina was prepped and a Foley catheter was placed.  Her  abdomen was prepped and draped in the usual sterile fashion.  A Pfannenstiel  skin incision was made and carried down sharply through the rectus fascia  and scored in the midline.  The fascia was taken off the muscles superiorly  and inferiorly without difficulty.  The muscles were divided; the peritoneal  cavity was entered.  A large self-retaining retractor was placed.  The  uterine cornu were grasped.  The dermoid cyst was taken down and double  suture ligated.  The left round ligament was then suture ligated and cut.  The infundibulopelvic ligament on the left was then clamped, cut, and double  suture ligated.  The vesicouterine serosa flap on the left was created.  The  uterine vessels were skeletonized and clamped, cut, and suture ligated on  the left.  The  right round ligament was suture ligated and cut.  The  uteroovarian ligament was clamped, cut and double suture ligated conserving  the ovary on the right side.  The vesicouterine serosal flap on the created  and the bladder was pushed off the lower uterine segment without difficulty.  The uterine vessels were clamped, cut, and suture ligated on the right.  Serial pedicles taken through the cardinal ligament down the cervix with  each pedicle being clamped, cut, sutured ligated.  The vagina was then  encountered.  It was cross clamped; and the specimen was removed.  Vaginal  angle sutures were placed and the vagina was closed interrupted figure-of-  eight sutures.   The pelvis was irrigated vigorously.  All pedicles found to be hemostatic.  Interseed was placed on the vaginal cuff to prevent postoperative adhesion  formation.  Again, all  pedicles were hemostatic.  The peritoneum and muscles  reapproximated loosely.  The fascia closed using #0 Vicryl running.  The  subcutaneous tissue was made hemostatic and irrigated.  The skin was closed  using skin staples. Marcaine 1/2% was injected for postoperative pain  relief.  The patient tolerated the procedure well.  She experienced 150 cc  of blood loss.  She received Ancef prophylactically.      ___________________________________________                                            Lazaro Arms, M.D.   [name]  D:  01/26/2003  T:  01/26/2003  Job:  [id number]

## 2010-07-21 NOTE — H&P (Signed)
NAME:  Mackenzie Gray, Mackenzie Gray                           ACCOUNT NO.:  [id number]   MEDICAL RECORD NO.:  [id number]                   PATIENT TYPE:  AMB   LOCATION:  DAY                                  FACILITY:  [facility]   PHYSICIAN:  Lazaro Arms, M.D.                DATE OF BIRTH:  1965/04/05   DATE OF ADMISSION:  DATE OF DISCHARGE:                                HISTORY & PHYSICAL   HISTORY OF PRESENT ILLNESS:  Mackenzie Gray is a 45 year old white female, gravida 5,  para 3, abortus 2, status post tubal ligation, who has been having quite a  bit of problem with lower abdominal and pelvic pain, especially in the left  lower quadrant.  She had a dermoid cyst which was found by CT scan in  January and was confirmed by sonogram in October.  The right ovary was  normal.  The uterus is normal but she suffers significantly heavy periods  with heavy cramping.  The patient states she can hardly walk with her  periods.  She wears tampons and pads together, soils her clothes and sheets  all the time.  In addition, the patient complained of daily loss of urine  three to four times a day and not actually waking up with it.  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  We discussed options including  endometrial ablation with laparoscopic oophorectomy.  The patient wanted  definitive therapy; as a result, she is admitted for TAH and LSO.  She does  not require any urological surgery because of the urodynamics which revealed  pure detrusor instability.  She was started on Ditropan for that in the  office last week.   PAST MEDICAL HISTORY:  Past medical history is negative.   PAST SURGICAL HISTORY:  She had a D&C in 1986 and tubal ligation in 1997.   REVIEW OF SYSTEMS:  Review of systems otherwise negative.   PHYSICAL EXAMINATION:  GENERAL:  The patient's weight is 182 pounds.  VITAL SIGNS:  Blood pressure 120/80.  HEENT:   Unremarkable.  NECK:  Thyroid is normal.  LUNGS:  Lungs are clear.  HEART:  Heart is regular rhythm without murmur, regurgitation or gallop.  BREASTS:  Breasts without mass, discharge or skin changes.  ABDOMEN:  Abdomen is benign.  Tenderness in the right lower quadrant.  PELVIC:  She has normal external genitalia.  Vagina is pink and moist  without discharge.  Pelvic exam is normal.  EXTREMITIES:  Extremities are warm with no edema.  NEUROLOGIC:  Exam is grossly intact.   IMPRESSION:  1. Left dermoid cyst with increasing pain.  2. Menometrorrhagia and dysmenorrhea.  3. 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   PLAN:  The patient is admitted for TAH/LSO.  She understands the risks,  benefits, indications and alternatives and will proceed.  ___________________________________________                                         Lazaro Arms, M.D.   [name]  D:  01/25/2003  T:  01/26/2003  Job:  [id number]

## 2010-07-21 NOTE — Consult Note (Signed)
NAME:  Mackenzie Gray, Mackenzie Gray                       ACCOUNT NO.:  [id number]   MEDICAL RECORD NO.:  [id number]                   PATIENT TYPE:   LOCATION:                                       FACILITY:  [facility]   PHYSICIAN:  R. Roetta Sessions, M.D.              DATE OF BIRTH:  March 16, 1965   DATE OF CONSULTATION:  04/10/2002  DATE OF DISCHARGE:                                   CONSULTATION   REQUESTING PHYSICIAN:  [name], M.D.   REASON FOR CONSULTATION:  Rectal bleeding, family history of Crohn's.   HISTORY OF PRESENT ILLNESS:  The patient is a 45 year old Caucasian female  patient of [name], M.D. who presents today for further evaluation  of above stated symptoms.  She has a two month history of intermittent  nausea, vomiting, occurs generally once or twice weekly, early satiety,  postprandial diarrhea alternating with constipation.  She complains of  crampy abdominal pain which occurs with and without eating.  She generally  has diarrhea postprandially.  She saw a large amount of rectal bleeding when  she had watery stool several weeks ago.  She has had continued rectal  bleeding since that time with defecation, although amount has been less.  Prior to development of these symptoms, she would have one BM twice a day.  Now she goes from having multiple loose to watery stools to skipping several  days without a bowel movement.  She complains of mucousy stools.  Denies any  melena.  She also has heartburn/acid reflux which has been really bad over  the last two weeks.  She is having nocturnal symptoms.  She was prescribed  Prevacid in the past which worked well for a while, but no longer was  working.  Currently, she is not on any medication.  Denies any dysphagia,  odynophagia.  She complains of upper abdominal burning, particularly in  epigastric and left upper quadrant.  She states she has had a vaginal  discharge which is milky, light.  She was checked for Chlamydia  and  gonorrhea which were negative.  Denies any dysuria.  She says that she lost  approximately 50 pounds unintentionally last year.  In April 2003 she  weighed 115 pounds.  She has gradually gained the weight back.   Thus far she has had a CT of the abdomen and pelvis March 27, 2002.  The  spleen was upper limits of normal in size to slightly enlarged.  There was  mild dilatation of the renal collection system, but no overt hydronephrosis.  There was questionable mild thickening of the wall of the gastrocardia near  the GE junction.  There was slight suspicion for pancreatitis.  There was  suspicion for pelvic inflammatory disease as well as there was a left  ovarian dermoid/teratoma.  Again, GC and Chlamydia probes were negative.   CURRENT MEDICATIONS:  None.   ALLERGIES:  CODEINE caused  GI upset.   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   PAST SURGICAL HISTORY:  Tubal ligation.   FAMILY HISTORY:  Mother has borderline diabetes mellitus and history of  stroke.  Father has history of peptic ulcer disease, acid reflux, and  stroke.  She has a sister with Crohn's disease and another sister and  brother with IBS.   SOCIAL HISTORY:  She has been married for one year.  She has four children.  She is employed with [employer].  She has never been a smoker.  She quit  drinking several years ago.   REVIEW OF SYSTEMS:  Please see HPI for GI, for general.  CARDIOPULMONARY:  Denies any chest pain, shortness of breath, palpitations.  Please see HPI  for GU.   PHYSICAL EXAMINATION:  VITAL SIGNS:  Weight 164, height 5 feet 4 inches,  temperature 98, blood pressure 100/72, pulse 82.  GENERAL:  Pleasant, well-nourished, well-developed Caucasian female in no  acute distress.  She appears to  not feel well.  SKIN:  Warm and dry.  No jaundice.  HEENT:  Pupils are equal, round, and reactive to light.  Conjunctivae are  pink.  Sclerae nonicteric.  Oropharyngeal mucosa moist and pink.  NECK:  No lesions, erythema, or exudate.  No lymphadenopathy, thyromegaly,  carotid bruits.  CHEST:  Lungs clear to auscultation.  CARDIAC:  Regular rate and rhythm.  Normal S1, S2.  No murmur, rub, or  gallop.  ABDOMEN:  Positive bowel sounds, soft, nondistended.  She has moderate to  severe epigastric tenderness to deep palpation.  She has mild or diffuse  abdominal pain to deep palpation.  No organomegaly or masses.  No rebound  tenderness, but does have some intentional guarding.  EXTREMITIES:  No edema.   IMPRESSION:  The patient is a pleasant 45 year old lady with two month  history of nausea, vomiting, early satiety, worsening reflux symptoms,  epigastric pain, diarrhea with rectal bleeding.  A CT of the abdomen and  pelvis revealed thickening of the gastrocardia as well as suspicious for  pancreatitis.  Given findings on CT as well as constellation of symptoms,  would be concerned about peptic ulcer disease and/or possibly mild  pancreatitis.  She does have typical reflux.  Describes some early satiety  which, again, is somewhat worrisome for peptic ulcer disease with partial  gastric outlet obstruction.  Loose stools which alternate with constipation  sound consistent with irritable bowel syndrome.  However, given persistence  of rectal bleeding and family history of Crohn's, we definitely need to rule  out inflammatory bowel disease.  Not mentioned before, on rectal examination  at [facility] A. Luking, M.D. office she was found to have an anal tear which  could explain her rectal bleeding.   PLAN:  1. Amylase, lipase, LFTs, CBC, MET-7, sedimentation rate.  2. EGD and colonoscopy.  3. Trial of Aciphex 20 mg p.o. daily number 21 samples given. 4. Darvocet-N 100 number 30 one p.o. q.4h.  p.r.n. pain, zero refills.  5. Phenergan 25 mg one p.o. q.4h. p.r.n. nausea number 30 with zero refills.  6.     Further recommendations to follow.  She is to call us if she has worsening     abdominal pain, develops fever, or has increase in  rectal bleeding.  I     would like to thank [name], M.D. for allowing [place] to take part in     the care of this patient.     Tana Coast, Pricilla Larsson, M.D.    LL/MEDQ  D:  04/10/2002  T:  04/10/2002  Job:  [id number]   cc:   Lorin Picket A. Gerda Diss, M.D.  [address]  [place]  [place] [address]  Fax: [phone]   R. Roetta Sessions, M.D.  [address]  [place]  [place] [address]  Fax: [phone]

## 2010-07-21 NOTE — Op Note (Signed)
Mackenzie Gray, Mackenzie Gray             ACCOUNT NO.:  [id number]   MEDICAL RECORD NO.:  [id number]          PATIENT TYPE:  OIB   LOCATION:  3005                         FACILITY:  [facility]   PHYSICIAN:  Cristi Loron, M.D.DATE OF BIRTH:  [birth year]   DATE OF PROCEDURE:  07/24/2006  DATE OF DISCHARGE:                               OPERATIVE REPORT   BRIEF HISTORY:  The patient is a 45 year old white female who has  suffered from neck and arm pain.  She failed medical management and was  worked up with a cervical MRI which demonstrated the patient had  spondylosis, stenosis, herniated nucleus pulposus, etcetera at C5-6.  Her symptoms seem consistent with a C6 radiculopathy.  I discussed the  various treatment options with the patient including surgery.  The  patient has weighed the risks, benefits and alternatives of surgery and  has decided to proceed with a C5-6 anterior cervical discectomy, fusion  and plating.   PREOPERATIVE DIAGNOSIS:  C5-6 herniated nucleus pulposus, spondylosis,  stenosis, cervical myelopathy, cervical radiculopathy, cervicalgia.   POSTOPERATIVE DIAGNOSIS:  C5-6 herniated nucleus pulposus, spondylosis,  stenosis, cervical myelopathy, cervical radiculopathy, cervicalgia.   PROCEDURE:  1. C5-6 extensive anterior cervical diskectomy/decompression.  2. C5-6 anterior interbody arthrodesis with local morselized autograft      bone.  3. C5-6 insertion of interbody prosthesis at C5-6 (Alphatec small      interbody prosthesis).  4. Anterior cervical plating C5-6 with Codman slim lock titanium plate      and screws.   SURGEON:  Cristi Loron, M.D.   ASSISTANT:  Hewitt Shorts, M.D.   ANESTHESIA:  General endotracheal.   ESTIMATED BLOOD LOSS:  50 mL.   SPECIMENS:  None.   DRAINS:  None.   COMPLICATIONS:  None.   PROCEDURE:  This patient is brought to the operating room by the  anesthesia team.  General endotracheal anesthesia was induced.  The  patient remained in the supine position.  A roll was placed under her  shoulders placing her neck in slight extension.  Her anterior cervical  region was then prepared with Betadine scrub and Betadine solution; and  sterile drapes were applied.   I then injected the area to be incised with Marcaine with epinephrine  solution.  I used the scalpel to make a transverse incision in the  patient's left anterior neck.  I used the Metzenbaum scissors to divide  the platysma muscle; and then to dissect the medial-to-  sternocleidomastoid muscle, jugular vein and carotid artery.  I  carefully dissected down towards the anterior cervical spine carefully  identifying the esophagus and retracting it medially.  I then used skin  swabs to clear the soft tissue from the anterior cervical spine.  I then  inserted a bent spinal needle to expose the intervertebral disk space.  We obtained an intraoperative radiograph to confirm our location.   I then used the electrocautery to detach the medial border of the longus  colli muscle bilateral from the C5-6 intervertebral space.  We inserted  the Caspar self-retaining retractor underneath the longus colli muscle  bilaterally at C5-6 for  exposure.  I then incised the C5-6  intervertebral disk and performed a partial intervertebral diskectomy  using the pituitary forceps and the Carlen curettes.  I then inserted  the distraction screws at C5 and C6 and distracted the interspace.  I  then used the high-speed drill to decorticate the tear at C5-6, and  drilled away the remainder of the C5-6 intervertebral disk, to drill  away some posterior spondylosis and to thin out the posterior  longitudinal ligament.  I then to incised the thin ligament with  arachnoid knife; and then removed it with the Kerrison punch  undercutting the vertebral endplates of C5-6 decompressing the thecal  sac.  I then performed a foraminotomy about the C5-6 nerve root  completing the  decompression at this level.   We now turned our attention to the arthrodesis.  We used the trial  spacers and determined to use a 5-mm Alphatec PEEK small interbody  prosthesis.  I prefilled this prosthesis with a combination of local  morselized autograft bone we obtained during the decompression, as well  as VITOSS bone graft ethmoidectomy.  I inserted the prosthesis into the  distractor at the C5-6 interspace; and then removed the distraction  screws.  There was good snug fit of the prosthesis in the interspace.   We now turned attention to anterior spinal instrumentation.  A high-  speed  drill tip removed some ventral spondylosis from the vertebral  endplates at C5-6 so that the plate would lay down flat.  We selected  appropriate length of Codman slim lock anterior cervical plate and laid  it along the anterior aspect of vertebral bodies at C5 and C6.  Then  drilled two 12-mm holes at C5-C6 and then secured the plate to the  vertebral bodies by placing two 12-mm, self-tapping screws at C5-C6.  We  then obtained the intraoperative radiograph that demonstrated good  position of plate screws and interbody prosthesis.  We, therefore  secured these screws to the plate by locking each cam.   We then obtained hemostasis with bipolar electrocautery, irrigated the  wound out with bacitracin solution; and then removed the retractor, and  inspected the esophagus for any damage.  There was none apparent.  We  then reapproximated the patient's platysma muscle with an interrupted 3-  0 Vicryl suture, subcutaneous tissue with an interrupted 3-0 Vicryl  suture; and the skin with Steri-Strips and Benzoin.  The wound was then  coated with bacitracin ointment, and sterile dressings applied; and the  drapes were removed.  The patient was subsequently extubated by the  anesthesia team and transported to the post anesthesia care unit in stable condition.  All sponge, instrument, and needle counts are  correct  on this case.      Cristi Loron, M.D.  Electronically Signed     JDJ/MEDQ  D:  07/24/2006  T:  07/24/2006  Job:  [id number]

## 2010-11-27 ENCOUNTER — Other Ambulatory Visit: Payer: Self-pay | Admitting: Family Medicine

## 2010-11-27 ENCOUNTER — Ambulatory Visit (HOSPITAL_COMMUNITY)
Admission: RE | Admit: 2010-11-27 | Discharge: 2010-11-27 | Disposition: A | Discharge status: Home / Self Care | Payer: Self-pay | Source: Ambulatory Visit | Attending: Family Medicine | Admitting: Family Medicine

## 2010-11-27 DIAGNOSIS — M25579 Pain in unspecified ankle and joints of unspecified foot: Secondary | ICD-10-CM | POA: Insufficient documentation

## 2010-11-27 DIAGNOSIS — R609 Edema, unspecified: Secondary | ICD-10-CM

## 2010-11-27 DIAGNOSIS — M25476 Effusion, unspecified foot: Secondary | ICD-10-CM | POA: Insufficient documentation

## 2010-11-27 DIAGNOSIS — M25473 Effusion, unspecified ankle: Secondary | ICD-10-CM | POA: Insufficient documentation

## 2010-12-28 ENCOUNTER — Encounter: Payer: Self-pay | Admitting: Orthopedic Surgery

## 2010-12-28 ENCOUNTER — Ambulatory Visit (INDEPENDENT_AMBULATORY_CARE_PROVIDER_SITE_OTHER): Payer: BC Managed Care – PPO | Admitting: Orthopedic Surgery

## 2010-12-28 DIAGNOSIS — S93401A Sprain of unspecified ligament of right ankle, initial encounter: Secondary | ICD-10-CM

## 2010-12-28 DIAGNOSIS — S93409A Sprain of unspecified ligament of unspecified ankle, initial encounter: Secondary | ICD-10-CM

## 2010-12-28 DIAGNOSIS — G9050 Complex regional pain syndrome I, unspecified: Secondary | ICD-10-CM

## 2010-12-29 ENCOUNTER — Encounter: Payer: Self-pay | Admitting: Orthopedic Surgery

## 2010-12-29 DIAGNOSIS — G9050 Complex regional pain syndrome I, unspecified: Secondary | ICD-10-CM | POA: Insufficient documentation

## 2010-12-29 DIAGNOSIS — S93401A Sprain of unspecified ligament of right ankle, initial encounter: Secondary | ICD-10-CM | POA: Insufficient documentation

## 2010-12-29 NOTE — Patient Instructions (Signed)
Start physical therapy, see her neurosurgeon for reevaluation of her LEFT arm

## 2010-12-29 NOTE — Progress Notes (Signed)
45 year old female twisted her RIGHT ankle on September 20 and was treated with an Aircast presents now a month and a half later with continued pain swelling and paresthesias in the RIGHT foot  She tried some Voltaren which did not help.  Review of systems weight gain, eyes water, heartburn, frequency, poor healing of the skin with itching, numbness and tingling in the foot and ankle and leg.  Easy bruising.  Excessive thirst.  Seasonal ALLERGIES.  Medical, surgical, family and social history reviewed and recorded.  No protruding factors.  Physical exam vital signs are stable general appearance is normal she is oriented x3 her mood and affect are flat her gait and station shoulder mild antalgic gait pattern favoring the RIGHT lower extremity.  She has tenderness and anterolateral lateral gutter and anterior talofibular ligament which is out of proportion to her injury.  She is swelling there.  She does have paresthesias in the foot.  Her range of motion remains normal.  Ankle is stable.  She has weakness in dorsiflexion and eversion.  Skin is intact.  Pulses normal temperature is normal.  No edema.  No lymphangitis.  Deep tendon reflexes at the ankle normal.  Coordination and balance normal.  X-rays were done at the hospital her RIGHT ankle 3 views they are negative.  Were reviewed.  Assessment one resolved ankle sprain with paresthesias possible complex regional pain syndrome.  Recommend physical therapy, ASO brace.  Start gabapentin 300 mg at night.  Return in 3 weeks.  Addendum the patient in a cervical disc surgery has RIGHT upper extremity unexplained an unusual pain which I believe is residual from her surgery and she should and was advised to see the neurosurgeon again regarding that.

## 2011-01-02 ENCOUNTER — Ambulatory Visit (HOSPITAL_COMMUNITY)
Admission: RE | Admit: 2011-01-02 | Discharge: 2011-01-02 | Disposition: A | Discharge status: Home / Self Care | Payer: BC Managed Care – PPO | Source: Ambulatory Visit | Attending: Orthopedic Surgery | Admitting: Orthopedic Surgery

## 2011-01-02 DIAGNOSIS — IMO0001 Reserved for inherently not codable concepts without codable children: Secondary | ICD-10-CM | POA: Insufficient documentation

## 2011-01-02 DIAGNOSIS — M6281 Muscle weakness (generalized): Secondary | ICD-10-CM | POA: Insufficient documentation

## 2011-01-02 DIAGNOSIS — M25676 Stiffness of unspecified foot, not elsewhere classified: Secondary | ICD-10-CM | POA: Insufficient documentation

## 2011-01-02 DIAGNOSIS — M25673 Stiffness of unspecified ankle, not elsewhere classified: Secondary | ICD-10-CM | POA: Insufficient documentation

## 2011-01-02 DIAGNOSIS — M25579 Pain in unspecified ankle and joints of unspecified foot: Secondary | ICD-10-CM | POA: Insufficient documentation

## 2011-01-02 DIAGNOSIS — R262 Difficulty in walking, not elsewhere classified: Secondary | ICD-10-CM | POA: Insufficient documentation

## 2011-01-02 NOTE — Progress Notes (Signed)
Physical Therapy Evaluation  Patient Details  Name: EVIN CHIRCO MRN: [id number] Date of Birth: 02-18-1966  Today's Date: 01/02/2011 Time: 4098-1191 Time Calculation (min): 42 min Visit#: 1  of 16   Re-eval: 02/01/11 Assessment Diagnosis: ankle sprain Next MD Visit: 02/02/11 Prior Therapy: none for this condition. Charge:  Evaluation / [place] Past Medical History: No past medical history on file. Past Surgical History:  Past Surgical History  Procedure Date  . Abdominal hysterectomy   . Disc removal 5-6 vert     Subjective Symptoms/Limitations Symptoms: The patient states that she was going down some steps and missed a step on Sept. 20th injuring her R ankle.  The patient states that she worked throughout the day on it but by the end of the day her ankle was significantly swollen and painful.  She went to the MD and had x-ray's.   There was no fracture but the pain and swelling has not decreased therefore she has been referred to physical therapy to improve her sx and functioning level. How long can you sit comfortably?: no problem. How long can you stand comfortably?: The patient has increased pain after an hour or two hours she is required by her work to stand for four hours at a time so she is bearing through this. How long can you walk comfortably?: The patient states that prior to her accident she was unable to walk very far due to having pain in both ankles.  She states that she has always had weak ankles and has worn high tops to support them.  She is able to walk less than 5 minutes now. Pain Assessment Currently in Pain?: Yes Pain Score:   8 Pain Location: Ankle Pain Orientation: Right Pain Onset: More than a month ago Pain Frequency: Constant (constant pain but varies in intensity.) Pain Relieving Factors: elevate and ice and heat. Effect of Pain on Daily Activities: increases.   Prior Functioning  Prior Function Able to Take Stairs?: Reciprically Vocation: Full time  employment Vocation Requirements: Works at [employer] in [employer]; and drives a school bus.  She is required to be on her feet; squat, push, pull  and reach. Leisure: Hobbies-yes (Comment) Comments: going to the gym to work out Objective: RLE AROM (degrees) Right Ankle Dorsiflexion 0-20: 16  Right Ankle Plantar Flexion 0-45: 50  Right Ankle Inversion: 5  Right Ankle Eversion: 35  RLE Strength Right Ankle Dorsiflexion:  (4+) Right Ankle Plantar Flexion: 4/5 Right Ankle Inversion: 4/5 Right Ankle Eversion: 4/5 LLE AROM (degrees) Left Ankle Dorsiflexion 0-20: 20  Left Ankle Plantar Flexion 0-45: 60  Left Ankle Inversion: 40  Left Ankle Eversion: 15  LLE Strength Left Ankle Dorsiflexion: 5/5 Left Ankle Plantar Flexion: 5/5 Left Ankle Inversion: 5/5 Left Ankle Eversion: 5/5  Figure 8 R LE 50.5;  L LE 48.5 Exercise/Treatments Ankle Exercises Ankle isometrics 5 rep all directions  SLS x 5-max SLS 3 seconds on right.  Modalities: Ultrasound Ultrasound Location: Lateral aspect of R ankle  Ultrasound Parameters: 1mg hz @ 1.25 w/cm2 Ultrasound Goals: Pain  Physical Therapy Assessment and Plan PT Assessment and Plan Clinical Impression Statement: Patient with increased edema and pain following an ankle sprain.  Pt will benefit from skilled Pt to return pt to prior functional status. Rehab Potential: Good Clinical Impairments Affecting Rehab Potential: pain, edema, decreased balance, decreased strength PT Frequency: Min 3X/week PT Duration: 6 weeks PT Treatment/Interventions: Therapeutic exercise;Other (comment) (modalities for pain.) PT Plan: see patient three times a week  for 6 wk for R ankles sprain with complex regional pain syndrome.  Begin towel crunch,  Balance master, standing BAPS level 3 and gentle edema massage following [place] to ankle next period. Third treatment begin heel raises, toe raises, and sidelying in/eversion with 3# wt.     Goals Home Exercise  Program Pt will Perform Home Exercise Program: Independently PT Short Term Goals Time to Complete Short Term Goals: 2 weeks PT Short Term Goal 1: Pain decreased by 4 levels  PT Short Term Goal 2: figure 8 down to 49.5 PT Short Term Goal 3: able to stand fo three hours without increased pain. PT Long Term Goals Time to Complete Long Term Goals:  (6 wks) PT Long Term Goal 1: Pt pain to be no greater than a 2 PT Long Term Goal 2: figure 8 to be no more than 49 Long Term Goal 3: able to stand for 4 hours without increased pain.  Problem List Patient Active Problem List  Diagnoses  . GERD  . CONSTIPATION  . IBS  . WEIGHT GAIN  . EPIGASTRIC PAIN  . BLOOD CHEMISTRY, ABNORMAL  . PUD, HX OF  . Right ankle sprain  . CRPS (complex regional pain syndrome type I)  . Pain in joint, ankle and foot    PT - End of Session Activity Tolerance: Patient tolerated treatment well General Behavior During Session: [facility] for tasks performed Cognition: [facility] for tasks performed   RUSSELL,CINDY 01/02/2011, 9:07 AM  Physician Documentation Your signature is required to indicate approval of the treatment plan as stated above.  Please sign and either send electronically or make a copy of this report for your files and return this physician signed original.   Please mark one 1.__approve of plan  2. ___approve of plan with the following conditions.   ______________________________                                                          _____________________ Physician Signature                                                                                                             Date

## 2011-01-02 NOTE — Patient Instructions (Addendum)
HEP isometric/sls

## 2011-01-05 ENCOUNTER — Ambulatory Visit (HOSPITAL_COMMUNITY)
Admission: RE | Admit: 2011-01-05 | Discharge: 2011-01-05 | Disposition: A | Discharge status: Home / Self Care | Payer: BC Managed Care – PPO | Source: Ambulatory Visit | Attending: Orthopedic Surgery | Admitting: Orthopedic Surgery

## 2011-01-05 DIAGNOSIS — M25676 Stiffness of unspecified foot, not elsewhere classified: Secondary | ICD-10-CM | POA: Insufficient documentation

## 2011-01-05 DIAGNOSIS — IMO0001 Reserved for inherently not codable concepts without codable children: Secondary | ICD-10-CM | POA: Insufficient documentation

## 2011-01-05 DIAGNOSIS — M25579 Pain in unspecified ankle and joints of unspecified foot: Secondary | ICD-10-CM | POA: Insufficient documentation

## 2011-01-05 DIAGNOSIS — M25673 Stiffness of unspecified ankle, not elsewhere classified: Secondary | ICD-10-CM | POA: Insufficient documentation

## 2011-01-05 DIAGNOSIS — M6281 Muscle weakness (generalized): Secondary | ICD-10-CM | POA: Insufficient documentation

## 2011-01-05 DIAGNOSIS — R262 Difficulty in walking, not elsewhere classified: Secondary | ICD-10-CM | POA: Insufficient documentation

## 2011-01-05 NOTE — Progress Notes (Signed)
Physical Therapy Treatment Patient Details  Name: Mackenzie Gray MRN: [id number] Date of Birth: 06-17-1965  Today's Date: 01/05/2011 Time: 4098-1191 Time Calculation (min): 46 min Visit#: 2  of 16   Re-eval: 02/01/11 Charges: Therex x 25'; Manual x 10' Ultrasound x 8'  Subjective: Symptoms/Limitations Symptoms: Pt reprots that her ankle is sore today but it felt better after last tx. Sweeling decreased after last session. Pain Assessment Currently in Pain?: Yes Pain Score:   6 Pain Location: Ankle Pain Orientation: Right   Exercise/Treatments Ankle Exercises Ankle Dorsiflexion: Right;Strengthening;Supine;Other (comment);Limitations;10 reps Ankle Dorsiflexion Limitations: isometric Ankle Plantar Flexion: Strengthening;Right;Supine;Limitations;10 reps Ankle Plantar Flexion Limitations: isometric Ankle Eversion: Strengthening;Right;Supine;Limitations;10 reps Ankle Eversion Limitations: isometric Ankle Inversion: Strengthening;Right;Seated;Limitations;10 reps Ankle Inversion Limitations: isometric  Towel Crunch: Limitations Towel Crunch Limitations: 1' BAPS: Level 3;Standing;5 reps Balance Master: Limits for Stability: Test Balance Master: Static: Test   Modalities Modalities: Ultrasound Manual Therapy Manual Therapy: Edema management Edema Management: Retro massage to R ankle x 10' Ultrasound Ultrasound Location: Lateral aspect of R ankle Ultrasound Parameters: @ 1.0w/cm2 pulsed x 8' Ultrasound Goals: Pain  Physical Therapy Assessment and Plan PT Assessment and Plan Clinical Impression Statement: Pt 12' late for appointemnt. R ankle easily fatigued with balance activities. Swelling decreased at end of session. Pt reports no pain increase at end of session. PT Treatment/Interventions: Therapeutic exercise;Other (comment) (Manual; Ultrasound) PT Plan: Begin heel raises, toe raises, and sidelying in/eversion with 3# wt next tx.     Problem List Patient Active  Problem List  Diagnoses  . GERD  . CONSTIPATION  . IBS  . WEIGHT GAIN  . EPIGASTRIC PAIN  . BLOOD CHEMISTRY, ABNORMAL  . PUD, HX OF  . Right ankle sprain  . CRPS (complex regional pain syndrome type I)  . Pain in joint, ankle and foot    PT - End of Session Activity Tolerance: Patient tolerated treatment well General Behavior During Session: [facility] for tasks performed Cognition: [facility] for tasks performed  Antonieta Iba 01/05/2011, 5:04 PM

## 2011-01-08 ENCOUNTER — Telehealth (HOSPITAL_COMMUNITY): Payer: Self-pay

## 2011-01-08 ENCOUNTER — Ambulatory Visit (HOSPITAL_COMMUNITY): Payer: BC Managed Care – PPO | Admitting: Physical Therapy

## 2011-01-09 ENCOUNTER — Telehealth (HOSPITAL_COMMUNITY): Payer: Self-pay

## 2011-01-10 ENCOUNTER — Ambulatory Visit (HOSPITAL_COMMUNITY)
Admission: RE | Admit: 2011-01-10 | Discharge: 2011-01-10 | Disposition: A | Discharge status: Home / Self Care | Payer: BC Managed Care – PPO | Source: Ambulatory Visit | Attending: Family Medicine | Admitting: Family Medicine

## 2011-01-10 DIAGNOSIS — M25579 Pain in unspecified ankle and joints of unspecified foot: Secondary | ICD-10-CM

## 2011-01-10 NOTE — Progress Notes (Signed)
Physical Therapy Treatment Patient Details  Name: Mackenzie Gray MRN: [id number] Date of Birth: [birth year]  Today's Date: 01/10/2011 Time: 7829-5621 Time Calculation (min): 36 min Visit#: 3  of 16   Re-eval: 02/01/11   Charge [place] 8'; there ex 18 massage 5' Subjective: Symptoms/Limitations Symptoms: I rolled my ankle again taking the trash out at home.      Exercise/Treatments Ankle Exercises Ankle Eversion: Strengthening;Sidelying;Limitations Ankle Eversion Limitations: 3# Ankle Inversion: Sidelying;5 reps;Strengthening;Limitations Ankle Inversion Limitations: 3#  Towel Crunch: 1 rep BAPS: Standing;Level 2;5 reps Balance Master: Limits for Stability: 1:04 (B advance to R only) Balance Master: Static: 2:00 (done B advance to R only)    Modalities Modalities: Ultrasound Ultrasound Ultrasound Parameters: 3 MHZ @ 1.2w/cm2 x8' Ultrasound Goals:  (Pt complained of sharp pain with the Ultrasound this was prior to the ultrasound being turned on).  Physical Therapy Assessment and Plan PT Assessment and Plan PT Plan: Pt 20 min late did not start heel or toe raises begin this next treatment    Goals  decreased pain Problem List Patient Active Problem List  Diagnoses  . GERD  . CONSTIPATION  . IBS  . WEIGHT GAIN  . EPIGASTRIC PAIN  . BLOOD CHEMISTRY, ABNORMAL  . PUD, HX OF  . Right ankle sprain  . CRPS (complex regional pain syndrome type I)  . Pain in joint, ankle and foot    PT - End of Session Activity Tolerance: Patient tolerated treatment well General Behavior During Session: [facility] for tasks performed Cognition: [facility] for tasks performed  Halley Kincer,CINDY 01/10/2011, 5:02 PM

## 2011-01-12 ENCOUNTER — Ambulatory Visit (HOSPITAL_COMMUNITY): Payer: BC Managed Care – PPO | Admitting: Physical Therapy

## 2011-01-12 ENCOUNTER — Telehealth (HOSPITAL_COMMUNITY): Payer: Self-pay | Admitting: Physical Therapy

## 2011-01-15 ENCOUNTER — Ambulatory Visit (HOSPITAL_COMMUNITY)
Admission: RE | Admit: 2011-01-15 | Discharge: 2011-01-15 | Disposition: A | Discharge status: Home / Self Care | Payer: BC Managed Care – PPO | Source: Ambulatory Visit | Attending: Family Medicine | Admitting: Family Medicine

## 2011-01-15 NOTE — Progress Notes (Signed)
Physical Therapy Treatment Patient Details  Name: Mackenzie Gray MRN: [id number] Date of Birth: May 09, 1965  Today's Date: 01/15/2011 Time: 5621-3086 Time Calculation (min): 44 min Visit#: 4  of 16   Re-eval: 02/01/11 Charges: Therex x 23' Estim w/ ice x 15'  Subjective: Symptoms/Limitations Symptoms: Pt reports increased pain this tx. Pain Assessment Currently in Pain?: Yes Pain Score:   8 Pain Location: Ankle Pain Orientation: Right   Exercise/Treatments Ankle Exercises Ankle Dorsiflexion: PROM;Limitations Ankle Dorsiflexion Limitations: Passive gastroc stretch 3x30" Ankle Eversion: 10 reps;Sidelying Ankle Eversion Limitations: 3# Ankle Inversion: 10 reps;Sidelying Ankle Inversion Limitations: 3# Heel Raises: 10 reps Toe Raise: 10 reps  Towel Crunch: 1 rep BAPS: Level 2;10 reps;Standing   Modalities Modalities: Electrical Stimulation;Cryotherapy Cryotherapy Number Minutes Cryotherapy: 15 Minutes (w/estim) Cryotherapy Location: Ankle (Right) Type of Cryotherapy: Ice pack Pharmacologist Location: R ankle Electrical Stimulation Action: decrease pain Electrical Stimulation Parameters: Pre mod 12.5 volts x 15' Electrical Stimulation Goals: Pain  Physical Therapy Assessment and Plan PT Assessment and Plan Clinical Impression Statement: Pt completed heel/toe raise w/o difficulty but c/o severe pain with BAPS board. Standing therex held secondary to pain. Gastroc tightness noted. Began passive gastroc stretch to increase mm flexibility. Estim applied to R ankle with ice pack to decrease pain. Pt c/o increased pain in posterior ankle with estim but overall pain decrease to 6/10 at end of session. PT Treatment/Interventions: Therapeutic exercise;Other (comment) (estim w/ ice) PT Plan: Continue per PT POC. Assess pain next session and effects of estim.     Problem List Patient Active Problem List  Diagnoses  . GERD  . CONSTIPATION  . IBS   . WEIGHT GAIN  . EPIGASTRIC PAIN  . BLOOD CHEMISTRY, ABNORMAL  . PUD, HX OF  . Right ankle sprain  . CRPS (complex regional pain syndrome type I)  . Pain in joint, ankle and foot    PT - End of Session Activity Tolerance: Patient limited by pain General Behavior During Session: [facility] for tasks performed Cognition: [facility] for tasks performed  Antonieta Iba 01/15/2011, 4:49 PM

## 2011-01-17 ENCOUNTER — Ambulatory Visit (HOSPITAL_COMMUNITY)
Admission: RE | Admit: 2011-01-17 | Discharge: 2011-01-17 | Disposition: A | Discharge status: Home / Self Care | Payer: BC Managed Care – PPO | Source: Ambulatory Visit | Attending: Physical Therapy | Admitting: Physical Therapy

## 2011-01-17 NOTE — Progress Notes (Signed)
Physical Therapy Treatment Patient Details  Name: Mackenzie Gray MRN: [id number] Date of Birth: 06-Mar-1965  Today's Date: 01/17/2011 Time: 7829-5621 Time Calculation (min): 40 min Visit#: 5  of 16   Re-eval: 02/01/11 Charges: Therex x 30' Manual x 8'  Subjective: Symptoms/Limitations Symptoms: Pt reports decreased pain today. Pt reports that he foot was numb the entire night after last session. Pain Assessment Currently in Pain?: Yes Pain Score:   5 Pain Location: Ankle Pain Orientation: Right   Exercise/Treatments Ankle Exercises Ankle Dorsiflexion Limitations: Slant board stretch 3X30" Ankle Eversion: 15 reps;Sidelying Ankle Eversion Limitations: 3# Ankle Inversion: 15 reps;Sidelying Ankle Inversion Limitations: 3# Heel Raises: 10 reps Toe Raise: 10 reps  Towel Crunch: Limitations Towel Crunch Limitations: 2' BAPS: Level 2;10 reps;Standing (Pt only able to complete 8 ccw circle and 5 cw circles) Balance Master: Limits for Stability: 44" RLE  Heel Walk (Round Trip): 1RT Toe Walk (Round Trip): 1RT   Manual Therapy Manual Therapy: Edema management Edema Management: gentle retro massage to ankle x 8'  Physical Therapy Assessment and Plan PT Assessment and Plan Clinical Impression Statement: Pt missed appointment at 8:00 this morning because she thought her appointment was at 4:00. Pt completes therex with increased stability and decreased c/o of pain. Pt c/o pain with active inversion. Estim not completed secondary to numbness in foot after last session. Pt refused ice pack and ice massage after therapist explained importance of ice. Pt displays decreased swelling after retrograde massage. PT Treatment/Interventions: Therapeutic exercise;Other (comment) (Manual) PT Plan: Continue per PT POC.     Problem List Patient Active Problem List  Diagnoses  . GERD  . CONSTIPATION  . IBS  . WEIGHT GAIN  . EPIGASTRIC PAIN  . BLOOD CHEMISTRY, ABNORMAL  . PUD, HX OF  .  Right ankle sprain  . CRPS (complex regional pain syndrome type I)  . Pain in joint, ankle and foot    PT - End of Session Activity Tolerance: Patient tolerated treatment well General Behavior During Session: [facility] for tasks performed Cognition: [facility] for tasks performed  Antonieta Iba 01/17/2011, 5:07 PM

## 2011-01-19 ENCOUNTER — Ambulatory Visit (HOSPITAL_COMMUNITY)
Admission: RE | Admit: 2011-01-19 | Discharge: 2011-01-19 | Disposition: A | Discharge status: Home / Self Care | Payer: BC Managed Care – PPO | Source: Ambulatory Visit | Attending: Family Medicine | Admitting: Family Medicine

## 2011-01-19 DIAGNOSIS — M25579 Pain in unspecified ankle and joints of unspecified foot: Secondary | ICD-10-CM

## 2011-01-19 NOTE — Progress Notes (Signed)
Physical Therapy Treatment Patient Details  Name: Mackenzie Gray MRN: [id number] Date of Birth: 1965/08/02  Today's Date: 01/19/2011 Time: 1027-2536 Time Calculation (min): 39 min Visit#: 6  of 16   Re-eval: 02/01/11  Charge: Retro massage 12 min Estim/Ice 15 min  Subjective: Symptoms/Limitations Symptoms: Pt stated upon entrance that she did not want to be here.  Reports increased pain following last session.  Pain scale 9/10, pt got kicked by a pt. playing kick ball today.   Pain Assessment Currently in Pain?: Yes Pain Score:   9 Pain Location: Ankle Pain Orientation: Right   Exercise/Treatments Ankle Exercises Heel Raises: 10 reps  Modalities Modalities: Cryotherapy;Electrical Stimulation Manual Therapy Edema Management: Retro massage to R ankle x 10' Cryotherapy Number Minutes Cryotherapy: 15 Minutes Cryotherapy Location: Ankle Type of Cryotherapy: Ice pack Pharmacologist Location: R ankle Electrical Stimulation Action: to decrease edema and pain Electrical Stimulation Parameters: Pre mod 12.5 volts x 15'  Electrical Stimulation Goals: Pain;Edema  Physical Therapy Assessment and Plan PT Assessment and Plan Clinical Impression Statement: Held therex activities secondary to so much pain at entrance.  Retro massage and Pre mod/ice complete with ankle elevated with pain reduced to 6/10 at end of session. PT Plan: Assess pain, continue with current POC.    Goals    Problem List Patient Active Problem List  Diagnoses  . GERD  . CONSTIPATION  . IBS  . WEIGHT GAIN  . EPIGASTRIC PAIN  . BLOOD CHEMISTRY, ABNORMAL  . PUD, HX OF  . Right ankle sprain  . CRPS (complex regional pain syndrome type I)  . Pain in joint, ankle and foot    PT - End of Session Activity Tolerance: Patient tolerated treatment well General Behavior During Session: [facility] for tasks performed Cognition: [facility] for tasks performed  Juel Burrow 01/19/2011, 5:21 PM

## 2011-01-22 ENCOUNTER — Ambulatory Visit (HOSPITAL_COMMUNITY): Payer: BC Managed Care – PPO | Admitting: Physical Therapy

## 2011-01-24 ENCOUNTER — Ambulatory Visit (HOSPITAL_COMMUNITY): Payer: BC Managed Care – PPO | Admitting: Physical Therapy

## 2011-01-29 ENCOUNTER — Ambulatory Visit (HOSPITAL_COMMUNITY)
Admission: RE | Admit: 2011-01-29 | Discharge: 2011-01-29 | Disposition: A | Discharge status: Home / Self Care | Payer: BC Managed Care – PPO | Source: Ambulatory Visit | Attending: Family Medicine | Admitting: Family Medicine

## 2011-01-29 NOTE — Progress Notes (Signed)
Physical Therapy Treatment Patient Details  Name: Mackenzie Gray MRN: [id number] Date of Birth: Dec 30, 1965  Today's Date: 01/29/2011 Time: 8295-6213 Time Calculation (min): 46 min Visit#: 7  of 16   Re-eval: 02/01/11 Charges:  therex 28', Estim with ice 15'    Subjective: Symptoms/Limitations Symptoms: hardly any pain today, just aches a little bit. Pain Assessment Currently in Pain?: No/denies   Exercise/Treatments Ankle Exercises Ankle Eversion: Sidelying;20 reps Ankle Eversion Limitations: 3# Ankle Inversion: 20 reps Ankle Inversion Limitations: 3# Heel Raises: 15 reps  Towel Crunch: Limitations Towel Crunch Limitations: 2' with 3# weight BAPS: Level 3;10 reps;Standing Balance Master: Limits for Stability: 34"RLE Balance Master: Static: 2X1:00 Heel Walk (Round Trip): 1RT Toe Walk (Round Trip): 1RT   Modalities Modalities: Cryotherapy;Electrical Stimulation Manual Therapy Manual Therapy: Edema management Cryotherapy Number Minutes Cryotherapy: 15 Minutes Cryotherapy Location: Ankle Type of Cryotherapy: Ice pack Pharmacologist Location: R ankle pre modulated 11 CV   x 15'  Physical Therapy Assessment and Plan PT Assessment and Plan Clinical Impression Statement: able to resume all therex today; only c/o pain with inv/ev on BAPS.  Noted swelling around lateral malleoli today but decreased after estim. PT Plan: Continue per POC.    Problem List Patient Active Problem List  Diagnoses  . GERD  . CONSTIPATION  . IBS  . WEIGHT GAIN  . EPIGASTRIC PAIN  . BLOOD CHEMISTRY, ABNORMAL  . PUD, HX OF  . Right ankle sprain  . CRPS (complex regional pain syndrome type I)  . Pain in joint, ankle and foot    PT - End of Session Activity Tolerance: Patient tolerated treatment well General Behavior During Session: [facility] for tasks performed Cognition: [facility] for tasks performed  Emeline Gins B 01/29/2011, 4:53 PM

## 2011-01-31 ENCOUNTER — Ambulatory Visit (HOSPITAL_COMMUNITY): Payer: BC Managed Care – PPO | Admitting: *Deleted

## 2011-02-02 ENCOUNTER — Ambulatory Visit (HOSPITAL_COMMUNITY): Payer: BC Managed Care – PPO

## 2011-02-02 ENCOUNTER — Telehealth (HOSPITAL_COMMUNITY): Payer: Self-pay

## 2011-02-02 ENCOUNTER — Ambulatory Visit (HOSPITAL_COMMUNITY): Payer: BC Managed Care – PPO | Admitting: Physical Therapy

## 2011-02-19 ENCOUNTER — Telehealth: Payer: Self-pay | Admitting: Orthopedic Surgery

## 2011-02-19 NOTE — Telephone Encounter (Signed)
Patient called to postpone her follow up appointment for re-check from having physical therapy due to financial reasons.  States doing better after therapy and is following home exercises. Will call back when she can re-schedule.  Her cell ph# is [phone], if any other recommendations at this time.

## 2011-02-20 ENCOUNTER — Ambulatory Visit: Payer: BC Managed Care – PPO | Admitting: Orthopedic Surgery

## 2011-10-15 ENCOUNTER — Other Ambulatory Visit: Payer: Self-pay | Admitting: Family Medicine

## 2011-10-15 DIAGNOSIS — R109 Unspecified abdominal pain: Secondary | ICD-10-CM

## 2011-10-17 ENCOUNTER — Other Ambulatory Visit: Payer: Self-pay | Admitting: Family Medicine

## 2011-10-17 DIAGNOSIS — Z139 Encounter for screening, unspecified: Secondary | ICD-10-CM

## 2011-10-19 ENCOUNTER — Ambulatory Visit (HOSPITAL_COMMUNITY)
Admission: RE | Admit: 2011-10-19 | Discharge: 2011-10-19 | Disposition: A | Discharge status: Home / Self Care | Payer: BC Managed Care – PPO | Source: Ambulatory Visit | Attending: Family Medicine | Admitting: Family Medicine

## 2011-10-19 DIAGNOSIS — R109 Unspecified abdominal pain: Secondary | ICD-10-CM | POA: Insufficient documentation

## 2011-10-19 DIAGNOSIS — Z139 Encounter for screening, unspecified: Secondary | ICD-10-CM

## 2011-10-19 MED ORDER — IOHEXOL 300 MG/ML  SOLN
100.0000 mL | Freq: Once | INTRAMUSCULAR | Status: AC | PRN
  Administered 2011-10-19: 100 mL via INTRAVENOUS

## 2012-03-14 ENCOUNTER — Other Ambulatory Visit: Payer: Self-pay | Admitting: Neurosurgery

## 2012-03-14 DIAGNOSIS — M542 Cervicalgia: Secondary | ICD-10-CM

## 2012-03-14 DIAGNOSIS — M5410 Radiculopathy, site unspecified: Secondary | ICD-10-CM

## 2012-03-14 DIAGNOSIS — M549 Dorsalgia, unspecified: Secondary | ICD-10-CM

## 2012-03-24 ENCOUNTER — Ambulatory Visit
Admission: RE | Admit: 2012-03-24 | Discharge: 2012-03-24 | Disposition: A | Discharge status: Home / Self Care | Payer: BC Managed Care – PPO | Source: Ambulatory Visit | Attending: Neurosurgery | Admitting: Neurosurgery

## 2012-03-24 DIAGNOSIS — M542 Cervicalgia: Secondary | ICD-10-CM

## 2012-03-24 DIAGNOSIS — M549 Dorsalgia, unspecified: Secondary | ICD-10-CM

## 2012-03-24 DIAGNOSIS — M5410 Radiculopathy, site unspecified: Secondary | ICD-10-CM

## 2012-07-14 ENCOUNTER — Telehealth: Payer: Self-pay | Admitting: Family Medicine

## 2012-07-14 NOTE — Telephone Encounter (Signed)
Thanks for the fyi

## 2012-07-14 NOTE — Telephone Encounter (Signed)
Pt just wanted to let you know she is having trouble with her shoulder again.  I have tried to get her an appt for early morning but nothing we had would work with her schedule .  She states she needs an after 4-4:30 appt or 8 am appt. This is just a [place]

## 2012-07-29 ENCOUNTER — Encounter: Payer: Self-pay | Admitting: Family Medicine

## 2012-07-29 ENCOUNTER — Ambulatory Visit (INDEPENDENT_AMBULATORY_CARE_PROVIDER_SITE_OTHER): Payer: BC Managed Care – PPO | Admitting: Family Medicine

## 2012-07-29 VITALS — BP 116/80 | Temp 98.5°F | Ht 64.0 in | Wt 210.0 lb

## 2012-07-29 DIAGNOSIS — Z639 Problem related to primary support group, unspecified: Secondary | ICD-10-CM

## 2012-07-29 DIAGNOSIS — M542 Cervicalgia: Secondary | ICD-10-CM

## 2012-07-29 DIAGNOSIS — F439 Reaction to severe stress, unspecified: Secondary | ICD-10-CM

## 2012-07-29 MED ORDER — DULOXETINE HCL 30 MG PO CPEP: 30.0000 mg | ORAL_CAPSULE | Freq: Every day | ORAL | Status: DC

## 2012-07-29 NOTE — Patient Instructions (Signed)
Please return to [place]  The FMLA form.
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
## 2012-08-08 ENCOUNTER — Encounter: Payer: Self-pay | Admitting: *Deleted

## 2012-11-26 ENCOUNTER — Encounter: Payer: Self-pay | Admitting: Family Medicine

## 2012-11-26 ENCOUNTER — Ambulatory Visit (INDEPENDENT_AMBULATORY_CARE_PROVIDER_SITE_OTHER): Payer: BC Managed Care – PPO | Admitting: Family Medicine

## 2012-11-26 VITALS — BP 130/80 | Ht 64.0 in | Wt 211.2 lb

## 2012-11-26 DIAGNOSIS — I878 Other specified disorders of veins: Secondary | ICD-10-CM | POA: Insufficient documentation

## 2012-11-26 DIAGNOSIS — M722 Plantar fascial fibromatosis: Secondary | ICD-10-CM | POA: Insufficient documentation

## 2012-11-26 DIAGNOSIS — I872 Venous insufficiency (chronic) (peripheral): Secondary | ICD-10-CM

## 2012-11-26 DIAGNOSIS — M5412 Radiculopathy, cervical region: Secondary | ICD-10-CM | POA: Insufficient documentation

## 2012-11-26 MED ORDER — TRIAMTERENE-HCTZ 37.5-25 MG PO CAPS: 1.0000 | ORAL_CAPSULE | Freq: Every day | ORAL | Status: DC

## 2012-11-26 MED ORDER — ALBUTEROL SULFATE HFA 108 (90 BASE) MCG/ACT IN AERS: 2.0000 | INHALATION_SPRAY | Freq: Four times a day (QID) | RESPIRATORY_TRACT | Status: DC | PRN

## 2012-11-26 NOTE — Progress Notes (Signed)
  Subjective:    Patient ID: Mackenzie Gray, female    DOB: 1965/12/27, 47 y.o.   MRN: [id number]  Back Pain This is a new problem. The current episode started more than 1 month ago. Associated symptoms include leg pain.   Feet pain bilat. Trouble walking. Very tender in the morning. Feet are very sensitive. Very painful when she tries to stand on. Not exercising at all.  Hx of neck pain and left arm throbing and numbness. See Dr. Lovell Sheehan note. Neurosurgeon. Saw back in February. At that time one and a CT in a few months but she has not been back yet. 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 He wanted to do further tests if she did not improve.   Patient notes significant swelling in the feet. Frustrated by this. Admits to some extra salt intake.  Claims her bipolar condition is stable. Currently not seen a mental health specialist.   Also reports a stabbing pain at sight of hysterectomy incision.  Also having numbness in arms again. Review of Systems  Musculoskeletal: Positive for back pain.   no abdominal pain no chest pain some loss of strength in left arm intermittently.     Objective:   Physical Exam  Alert occasionally tearful during exam neck supple lungs clear. Heart regular in rhythm. Left shoulder good range of motion distal hand sensation question diminished. Grip question diminished. Ankles trace edema significant heel pain and tenderness. Pulses good.      Assessment & Plan:  Impression 1 neuropathic pain and dysfunction unilateral and person with known cervical disc disease. Times get back to neurosurgeon. #2 plantar fascitis severe patient very compromised by a discussed. #3 venous stasis with edema discussed plan initiate Dyazide. Consultation with podiatrist and back to surgical specialist. 25 minutes spent most in discussion. WSL

## 2012-11-29 ENCOUNTER — Other Ambulatory Visit: Payer: Self-pay | Admitting: Nurse Practitioner

## 2013-01-01 ENCOUNTER — Encounter: Payer: Self-pay | Admitting: Nurse Practitioner

## 2013-01-01 ENCOUNTER — Encounter: Payer: Self-pay | Admitting: Family Medicine

## 2013-01-01 ENCOUNTER — Ambulatory Visit (INDEPENDENT_AMBULATORY_CARE_PROVIDER_SITE_OTHER): Payer: BC Managed Care – PPO | Admitting: Nurse Practitioner

## 2013-01-01 VITALS — BP 122/80 | Temp 98.8°F | Ht 64.0 in | Wt 207.0 lb

## 2013-01-01 DIAGNOSIS — J069 Acute upper respiratory infection, unspecified: Secondary | ICD-10-CM

## 2013-01-01 DIAGNOSIS — J209 Acute bronchitis, unspecified: Secondary | ICD-10-CM

## 2013-01-01 MED ORDER — AZITHROMYCIN 250 MG PO TABS: ORAL_TABLET | ORAL | Status: DC

## 2013-01-01 MED ORDER — PREDNISONE 20 MG PO TABS: ORAL_TABLET | ORAL | Status: DC

## 2013-01-01 MED ORDER — METHYLPREDNISOLONE ACETATE 40 MG/ML IJ SUSP
40.0000 mg | Freq: Once | INTRAMUSCULAR | Status: AC
  Administered 2013-01-01: 40 mg via INTRAMUSCULAR

## 2013-01-04 ENCOUNTER — Encounter: Payer: Self-pay | Admitting: Nurse Practitioner

## 2013-01-04 NOTE — Progress Notes (Signed)
Subjective:  Presents for complaints of sinus cough and congestion for the past 4 days. Fever has improved. Vomiting x2 2 days ago, none since. No diarrhea. Headache. Frequent slightly productive cough. Green mucus at times. Slight wheeze at times, using her albuterol 3-4 times per day. Bilateral ear pain. Sore throat mainly at night and first thing in the morning.  Objective:   BP 122/80  Temp(Src) 98.8 F (37.1 C)  Ht 5\' 4"  (1.626 m)  Wt 207 lb (93.895 kg)  BMI 35.51 kg/m2 NAD. Alert, oriented. TMs significant clear effusion, no erythema. Pharynx mildly erythematous with green PND noted. Neck supple with mild soft slightly tender anterior adenopathy. Lungs faint expiratory crackles anterior with poor inspiratory effort. No wheezing or tachypnea. Heart regular rate rhythm.   Assessment:Acute upper respiratory infections of unspecified site - Plan: methylPREDNISolone acetate (DEPO-MEDROL) injection 40 mg  Acute bronchitis - Plan: methylPREDNISolone acetate (DEPO-MEDROL) injection 40 mg  Plan: Meds ordered this encounter  Medications  . methylPREDNISolone acetate (DEPO-MEDROL) injection 40 mg    Sig:   . azithromycin (ZITHROMAX Z-PAK) 250 MG tablet    Sig: Take 2 tablets (500 mg) on  Day 1,  followed by 1 tablet (250 mg) once daily on Days 2 through 5.    Dispense:  6 each    Refill:  0    Order Specific Question:  Supervising Provider    Answer:  Merlyn Albert [[id number]]  . predniSONE (DELTASONE) 20 MG tablet    Sig: 3 po qd x 3 d then 2 po qd x 3 d then 1 po qd x 3 d    Dispense:  18 tablet    Refill:  0    Order Specific Question:  Supervising Provider    Answer:  Riccardo Dubin   Given prescription for prednisone to use in 2-3 days if no improvement in her symptoms. Call back next week if no improvement, sooner if worse.

## 2013-01-06 ENCOUNTER — Telehealth: Payer: Self-pay | Admitting: Family Medicine

## 2013-01-06 DIAGNOSIS — R21 Rash and other nonspecific skin eruption: Secondary | ICD-10-CM

## 2013-01-06 NOTE — Telephone Encounter (Signed)
Referral initiated in the system. 

## 2013-01-06 NOTE — Telephone Encounter (Signed)
Pt requesting referral to dermatology for place on her right arm.  Now with blisters near it.  If "ok" to refer, please initiate in system so I may process.  If pt NTBS, I can call her to set up appt here.  Please advise

## 2013-01-06 NOTE — Telephone Encounter (Signed)
ok 

## 2013-02-24 ENCOUNTER — Encounter: Payer: Self-pay | Admitting: Family Medicine

## 2013-02-24 ENCOUNTER — Ambulatory Visit (INDEPENDENT_AMBULATORY_CARE_PROVIDER_SITE_OTHER): Payer: BC Managed Care – PPO | Admitting: Family Medicine

## 2013-02-24 VITALS — BP 134/80 | Temp 98.4°F | Ht 64.0 in | Wt 209.0 lb

## 2013-02-24 DIAGNOSIS — J329 Chronic sinusitis, unspecified: Secondary | ICD-10-CM

## 2013-02-24 MED ORDER — FLUCONAZOLE 150 MG PO TABS: ORAL_TABLET | ORAL | Status: DC

## 2013-02-24 MED ORDER — AMOXICILLIN-POT CLAVULANATE 875-125 MG PO TABS: 1.0000 | ORAL_TABLET | Freq: Two times a day (BID) | ORAL | Status: AC

## 2013-02-24 NOTE — Progress Notes (Signed)
   Subjective:    Patient ID: Mackenzie Gray, female    DOB: [birth year], 47 y.o.   MRN: [id number]  Sinusitis This is a new problem. The current episode started in the past 7 days. Associated symptoms include coughing, ear pain, headaches and sinus pressure. Treatments tried: mucinex.  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   Respiratory: Positive for cough.   Neurological: Positive for headaches.   no vomiting no diarrhea no rash ROS otherwise negative     Objective:   Physical Exam Alert moderate malaise. H&T frontal maxillary tenderness evident.. Normal neck supple. Lungs bilateral mild wheezes heart regular in rhythm.       Assessment & Plan:  And in impression rhinosinusitis and bronchitis with reactive airways plan Augmentin twice a day 10 days. Symptomatic care discussed. Diflucan just in case. Ventolin when necessary. Symptomatic care discussed. WSL

## 2013-03-12 ENCOUNTER — Ambulatory Visit (INDEPENDENT_AMBULATORY_CARE_PROVIDER_SITE_OTHER): Payer: BC Managed Care – PPO | Admitting: Family Medicine

## 2013-03-12 ENCOUNTER — Encounter: Payer: Self-pay | Admitting: Family Medicine

## 2013-03-12 ENCOUNTER — Telehealth: Payer: Self-pay | Admitting: Family Medicine

## 2013-03-12 VITALS — BP 122/86 | Temp 98.9°F | Ht 64.0 in | Wt 209.5 lb

## 2013-03-12 DIAGNOSIS — R059 Cough, unspecified: Secondary | ICD-10-CM

## 2013-03-12 DIAGNOSIS — J111 Influenza due to unidentified influenza virus with other respiratory manifestations: Secondary | ICD-10-CM

## 2013-03-12 DIAGNOSIS — R05 Cough: Secondary | ICD-10-CM

## 2013-03-12 MED ORDER — ALBUTEROL SULFATE HFA 108 (90 BASE) MCG/ACT IN AERS: 2.0000 | INHALATION_SPRAY | Freq: Four times a day (QID) | RESPIRATORY_TRACT | Status: DC | PRN

## 2013-03-12 MED ORDER — OSELTAMIVIR PHOSPHATE 75 MG PO CAPS: 75.0000 mg | ORAL_CAPSULE | Freq: Two times a day (BID) | ORAL | Status: AC

## 2013-03-12 MED ORDER — ONDANSETRON 4 MG PO TBDP: 4.0000 mg | ORAL_TABLET | Freq: Four times a day (QID) | ORAL | Status: DC | PRN

## 2013-03-12 MED ORDER — BENZONATATE 100 MG PO CAPS: 100.0000 mg | ORAL_CAPSULE | Freq: Four times a day (QID) | ORAL | Status: DC | PRN

## 2013-03-12 MED ORDER — METHYLPREDNISOLONE ACETATE 40 MG/ML IJ SUSP
40.0000 mg | Freq: Once | INTRAMUSCULAR | Status: AC
  Administered 2013-03-12: 40 mg via INTRAMUSCULAR

## 2013-03-12 NOTE — Telephone Encounter (Signed)
Medication sent to pharmacy. Patient was notified.  

## 2013-03-12 NOTE — Progress Notes (Signed)
   Subjective:    Patient ID: Mackenzie Gray, female    DOB: Feb 15, 1966, 48 y.o.   MRN: [id number]  Fever  This is a new problem. The current episode started in the past 7 days. The problem occurs intermittently. The problem has been unchanged. The maximum temperature noted was 102 to 102.9 F. Associated symptoms include coughing, diarrhea, muscle aches and vomiting. She has tried nothing for the symptoms. The treatment provided no relief.   First hit with really bad headache severe un nature  No energy   No appetite  Bad achey, t max 102  Feels relly bad  Has flared up the wheezing poorly     Review of Systems  Constitutional: Positive for fever.  Respiratory: Positive for cough.   Gastrointestinal: Positive for vomiting and diarrhea.       Objective:   Physical Exam  Alert moderate malaise. Vitals reviewed. H&T some nasal congestion neck supple. Lungs intermittent deep cough no crackles no wheezes except with cough no tachypnea heart regular rate and rhythm.      Assessment & Plan:  Impression flu discussed plan warning signs discussed. Symptomatic care discussed. Tamiflu twice a day 5 days. Albuterol when necessary. Tessalon Perles when necessary. Steroid injection with history of reactive airways WSL

## 2013-03-12 NOTE — Telephone Encounter (Signed)
Pt seen today, given Tamiflu, now vomitting and unable to keep anything down, can we call in something for the vomitting  Pt uses [facility] and please have it delivered Please call pt when done [phone]

## 2013-03-12 NOTE — Telephone Encounter (Signed)
zofran 4 odt twenty four one q 6 prn

## 2013-03-16 ENCOUNTER — Encounter: Payer: Self-pay | Admitting: Family Medicine

## 2013-03-16 ENCOUNTER — Telehealth: Payer: Self-pay | Admitting: Family Medicine

## 2013-03-16 ENCOUNTER — Ambulatory Visit (INDEPENDENT_AMBULATORY_CARE_PROVIDER_SITE_OTHER): Payer: BC Managed Care – PPO | Admitting: Family Medicine

## 2013-03-16 VITALS — BP 122/82 | Temp 98.6°F | Ht 64.0 in | Wt 209.6 lb

## 2013-03-16 DIAGNOSIS — J329 Chronic sinusitis, unspecified: Secondary | ICD-10-CM

## 2013-03-16 DIAGNOSIS — J310 Chronic rhinitis: Secondary | ICD-10-CM

## 2013-03-16 MED ORDER — LEVOFLOXACIN 500 MG PO TABS: 500.0000 mg | ORAL_TABLET | Freq: Every day | ORAL | Status: AC

## 2013-03-16 MED ORDER — PREDNISONE 20 MG PO TABS: ORAL_TABLET | ORAL | Status: DC

## 2013-03-16 NOTE — Telephone Encounter (Signed)
Patient wants refill on tamiflu not feeling any better and states she has to go back to work tomorrow. Still has low grade fever, cough, wheezing. Call  Into [facility] asked them to deliver please.

## 2013-03-16 NOTE — Telephone Encounter (Signed)
Patient requested to come in and be seen instead. Transferred to front desk to schedule appointment.

## 2013-03-16 NOTE — Telephone Encounter (Signed)
We mnever ref tamiflu, good only one round, persist sympt suggest secondary infxn, lev 500 qd for teb d

## 2013-03-16 NOTE — Progress Notes (Signed)
   Subjective:    Patient ID: Mackenzie Gray, female    DOB: 1965/08/01, 48 y.o.   MRN: [id number]  Cough This is a new problem. The current episode started in the past 7 days. Associated symptoms include nasal congestion and wheezing.   Patient was diagnosed with the flu last week and now having problems with deep cough and wheezing.  Cough is productive at times. Wheezing fairly severe.  Of note patient took amoxicillin before been seen last week and starting on Tamiflu. Review of Systems  Respiratory: Positive for cough and wheezing.    No vomiting no diarrhea diminished energy no rash ROS otherwise negative    Objective:   Physical Exam  Alert HET moderate malaise nasal congestion neck supple. Lungs bilateral wheezes no inspiratory crackles no tachypnea heart      Assessment & Plan:  Impression post flu bronchitis with exacerbation of reactive airways plan Levaquin daily 10 days. Prednisone taper.

## 2013-04-03 ENCOUNTER — Telehealth: Payer: Self-pay | Admitting: Family Medicine

## 2013-04-03 MED ORDER — CLARITHROMYCIN 500 MG PO TABS: 500.0000 mg | ORAL_TABLET | Freq: Two times a day (BID) | ORAL | Status: DC

## 2013-04-03 NOTE — Telephone Encounter (Signed)
Patient states still sick coughing real bad, running fever at night but working. Cant hardly caught her breathe. States that the Levaquin 500 didn't help and deltasone 20mg  gives her a headache. Is there anything else you can prescribe. You can call her at [phone] until 2;00 or at 1:00 on her cell phone that her break.

## 2013-04-03 NOTE — Telephone Encounter (Signed)
Med sent to pharm. Pt notified. On voicemail.  

## 2013-04-03 NOTE — Addendum Note (Signed)
Addended by: Metro KungICHARDS, WENDY M on: 04/03/2013 04:26 PM   Modules accepted: Orders

## 2013-04-03 NOTE — Telephone Encounter (Signed)
biaxin 500 bid ten d 

## 2013-09-07 ENCOUNTER — Ambulatory Visit: Payer: BC Managed Care – PPO | Admitting: Nurse Practitioner

## 2013-09-09 ENCOUNTER — Ambulatory Visit (INDEPENDENT_AMBULATORY_CARE_PROVIDER_SITE_OTHER): Payer: BC Managed Care – PPO | Admitting: Nurse Practitioner

## 2013-09-09 ENCOUNTER — Encounter: Payer: Self-pay | Admitting: Nurse Practitioner

## 2013-09-09 VITALS — BP 138/90 | Temp 98.4°F | Ht 64.0 in | Wt 208.0 lb

## 2013-09-09 DIAGNOSIS — R200 Anesthesia of skin: Secondary | ICD-10-CM

## 2013-09-09 DIAGNOSIS — R209 Unspecified disturbances of skin sensation: Secondary | ICD-10-CM

## 2013-09-09 DIAGNOSIS — R4781 Slurred speech: Secondary | ICD-10-CM

## 2013-09-09 DIAGNOSIS — R4789 Other speech disturbances: Secondary | ICD-10-CM

## 2013-09-09 DIAGNOSIS — M79609 Pain in unspecified limb: Secondary | ICD-10-CM

## 2013-09-09 DIAGNOSIS — M79601 Pain in right arm: Secondary | ICD-10-CM

## 2013-09-09 NOTE — Patient Instructions (Addendum)
Vertical sleeve gastrectomy [email or web address] Take daily low dose aspirin

## 2013-09-11 ENCOUNTER — Ambulatory Visit (HOSPITAL_COMMUNITY)
Admission: RE | Admit: 2013-09-11 | Discharge: 2013-09-11 | Disposition: A | Discharge status: Home / Self Care | Payer: BC Managed Care – PPO | Source: Ambulatory Visit | Attending: Nurse Practitioner | Admitting: Nurse Practitioner

## 2013-09-11 ENCOUNTER — Encounter: Payer: Self-pay | Admitting: Nurse Practitioner

## 2013-09-11 DIAGNOSIS — G939 Disorder of brain, unspecified: Secondary | ICD-10-CM | POA: Insufficient documentation

## 2013-09-11 DIAGNOSIS — R51 Headache: Secondary | ICD-10-CM | POA: Insufficient documentation

## 2013-09-11 DIAGNOSIS — R252 Cramp and spasm: Secondary | ICD-10-CM | POA: Insufficient documentation

## 2013-09-11 DIAGNOSIS — R209 Unspecified disturbances of skin sensation: Secondary | ICD-10-CM | POA: Insufficient documentation

## 2013-09-11 DIAGNOSIS — R4781 Slurred speech: Secondary | ICD-10-CM

## 2013-09-11 DIAGNOSIS — M79601 Pain in right arm: Secondary | ICD-10-CM

## 2013-09-11 DIAGNOSIS — R200 Anesthesia of skin: Secondary | ICD-10-CM

## 2013-09-11 NOTE — Progress Notes (Signed)
Subjective:  Presents for c/o a cyst on the right lateral foot since April. Area tender with walking. Drained the area herself with a sewing needle; white material noted. No change in size. History of injections for plantar fasciitis. Also c/o of a single incident that occurred last week. Began with a slight headache on top of the head that progressed into a banging, throbbing headache 8/10 on pain scale. Photosensitivity and phonophobia. Slight blurred vision. Nausea with vomiting. Dull pain in right arm, no numbness or weakness. Slurred speech. Right side of face felt "heavy". Slight drooling. Resolved after rest, no further symptoms. Did not go to ED due to copay. Nonsmoker. Taking estrogen qod. FMH: father had stroke in late 9140s; note a smoker.  Objective:   BP 138/90  Temp(Src) 98.4 F (36.9 C)  Ht 5\' 4"  (1.626 m)  Wt 208 lb (94.348 kg)  BMI 35.69 kg/m2 NAD. Alert, oriented. Anxious affect. Lungs clear. Heart RRR. Pupils equal and responsive to light. Speech clear. Reflexes nl. Muscle strength 5+ bilat. Gait nl.   Assessment: Slurred speech - Plan: MR Brain Wo Contrast  Facial numbness - Plan: MR Brain Wo Contrast  Right arm pain - Plan: MR Brain Wo Contrast  Cyst right foot  Plan: patient to recheck with podiatry about cyst. MRI ordered. Recommend daily low dose aspirin. 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 Further follow up after MRI, call or go to ED sooner if symptoms recur.

## 2013-09-15 ENCOUNTER — Telehealth: Payer: Self-pay | Admitting: *Deleted

## 2013-09-15 NOTE — Telephone Encounter (Signed)
Pt has referral to neurology. Pt will be out of town August 14-16. Requesting appt before this because she goes back to work aug 17. Advised pt i would take a note but it can awhile to get in with specialist so it may be after august 17th.

## 2013-09-16 ENCOUNTER — Other Ambulatory Visit: Payer: Self-pay | Admitting: Nurse Practitioner

## 2013-09-16 DIAGNOSIS — R4781 Slurred speech: Secondary | ICD-10-CM

## 2013-09-16 DIAGNOSIS — R200 Anesthesia of skin: Secondary | ICD-10-CM

## 2013-09-29 ENCOUNTER — Encounter: Payer: Self-pay | Admitting: Neurology

## 2013-09-29 ENCOUNTER — Ambulatory Visit (INDEPENDENT_AMBULATORY_CARE_PROVIDER_SITE_OTHER): Payer: BC Managed Care – PPO | Admitting: Neurology

## 2013-09-29 VITALS — BP 140/84 | HR 84 | Ht 64.0 in | Wt 211.5 lb

## 2013-09-29 DIAGNOSIS — R51 Headache: Secondary | ICD-10-CM

## 2013-09-29 DIAGNOSIS — R209 Unspecified disturbances of skin sensation: Secondary | ICD-10-CM

## 2013-09-29 DIAGNOSIS — R200 Anesthesia of skin: Secondary | ICD-10-CM

## 2013-09-29 LAB — HEMOGLOBIN A1C
Hgb A1c MFr Bld: 5.4 % (ref <5.7)
Mean Plasma Glucose: 108 mg/dL (ref <117)

## 2013-09-29 LAB — COMPREHENSIVE METABOLIC PANEL
ALBUMIN: 4.3 g/dL (ref 3.5–5.2)
ALT: 27 U/L (ref 0–35)
AST: 20 U/L (ref 0–37)
Alkaline Phosphatase: 61 U/L (ref 39–117)
BUN: 11 mg/dL (ref 6–23)
CALCIUM: 9 mg/dL (ref 8.4–10.5)
CHLORIDE: 104 meq/L (ref 96–112)
CO2: 26 mEq/L (ref 19–32)
Creat: 0.69 mg/dL (ref 0.50–1.10)
GLUCOSE: 86 mg/dL (ref 70–99)
POTASSIUM: 4.4 meq/L (ref 3.5–5.3)
Sodium: 138 mEq/L (ref 135–145)
Total Bilirubin: 0.7 mg/dL (ref 0.2–1.2)
Total Protein: 7.4 g/dL (ref 6.0–8.3)

## 2013-09-29 LAB — CBC
HEMATOCRIT: 40.6 % (ref 36.0–46.0)
HEMOGLOBIN: 14 g/dL (ref 12.0–15.0)
MCH: 30.4 pg (ref 26.0–34.0)
MCHC: 34.5 g/dL (ref 30.0–36.0)
MCV: 88.1 fL (ref 78.0–100.0)
Platelets: 269 10*3/uL (ref 150–400)
RBC: 4.61 MIL/uL (ref 3.87–5.11)
RDW: 14 % (ref 11.5–15.5)
WBC: 5.7 10*3/uL (ref 4.0–10.5)

## 2013-09-29 LAB — LIPID PANEL
Cholesterol: 190 mg/dL (ref 0–200)
HDL: 59 mg/dL (ref >39)
LDL CALC: 103 mg/dL — AB (ref 0–99)
TRIGLYCERIDES: 138 mg/dL (ref <150)
Total CHOL/HDL Ratio: 3.2 Ratio
VLDL: 28 mg/dL (ref 0–40)

## 2013-09-29 NOTE — Patient Instructions (Signed)
1. Bloodwork for CBC, CMP, lipid panel, HbA1c 2. Routine EEG 3. Continue daily aspirin 4. If symptoms recur, go to ER immediately
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
## 2013-10-05 ENCOUNTER — Encounter: Payer: BC Managed Care – PPO | Admitting: Nurse Practitioner

## 2013-10-05 DIAGNOSIS — R51 Headache: Secondary | ICD-10-CM | POA: Insufficient documentation

## 2013-10-05 DIAGNOSIS — R200 Anesthesia of skin: Secondary | ICD-10-CM | POA: Insufficient documentation

## 2013-10-05 DIAGNOSIS — R519 Headache, unspecified: Secondary | ICD-10-CM | POA: Insufficient documentation

## 2013-10-13 ENCOUNTER — Ambulatory Visit (INDEPENDENT_AMBULATORY_CARE_PROVIDER_SITE_OTHER): Payer: BC Managed Care – PPO | Admitting: Neurology

## 2013-10-13 DIAGNOSIS — R209 Unspecified disturbances of skin sensation: Secondary | ICD-10-CM

## 2013-10-13 DIAGNOSIS — R51 Headache: Secondary | ICD-10-CM

## 2013-10-13 DIAGNOSIS — R200 Anesthesia of skin: Secondary | ICD-10-CM

## 2013-10-14 ENCOUNTER — Ambulatory Visit (INDEPENDENT_AMBULATORY_CARE_PROVIDER_SITE_OTHER): Payer: BC Managed Care – PPO | Admitting: Neurology

## 2013-10-14 DIAGNOSIS — R200 Anesthesia of skin: Secondary | ICD-10-CM

## 2013-10-14 DIAGNOSIS — R51 Headache: Secondary | ICD-10-CM

## 2013-10-14 DIAGNOSIS — R209 Unspecified disturbances of skin sensation: Secondary | ICD-10-CM

## 2013-10-19 NOTE — Procedures (Signed)
ELECTROENCEPHALOGRAM REPORT  Date of Study: 10/13/2013  Patient's Name: Mackenzie Gray MRN: [id number] Date of Birth: 25-Apr-1965  Referring Provider: Dr. Patrcia DollyKaren Aquino  Clinical History: This is a 48 year old woman with an episode of transient right-sided symptoms with slurred speech and associated headache.  Medications: HCTZ, Estrace  Technical Summary: A multichannel digital EEG recording measured by the international 10-20 system with electrodes applied with paste and impedances below 5000 ohms performed in our laboratory with EKG monitoring in an awake and asleep patient.  Hyperventilation and photic stimulation were performed.  The digital EEG was referentially recorded, reformatted, and digitally filtered in a variety of bipolar and referential montages for optimal display.   Description: The patient is awake and asleep during the recording.  During maximal wakefulness, there is a symmetric, medium voltage 10 Hz posterior dominant rhythm that attenuates with eye opening.  The record is symmetric.  During drowsiness and sleep, there is an increase in theta slowing of the background.  Vertex waves and symmetric sleep spindles were seen.  Hyperventilation and photic stimulation did not elicit any abnormalities.  There were no epileptiform discharges or electrographic seizures seen.    EKG lead was unremarkable.  Impression: This awake and asleep EEG is normal.    Clinical Correlation: A normal EEG does not exclude a clinical diagnosis of epilepsy.  If further clinical questions remain, prolonged EEG may be helpful.  Clinical correlation is advised.   Patrcia DollyKaren Aquino, M.D.

## 2013-10-20 ENCOUNTER — Telehealth: Payer: Self-pay | Admitting: Neurology

## 2013-10-20 NOTE — Telephone Encounter (Signed)
Pt called f/u on the results for her EEG she had done last week. C/B [phone]

## 2013-10-20 NOTE — Telephone Encounter (Signed)
Have you had a chance to review this?

## 2013-10-21 NOTE — Telephone Encounter (Signed)
Pls let her know the 24-hour EEG is normal, no evidence of seizures. Continue daily aspirin, keep headache diary, we will discuss further headache treatment on her follow-up appt. Thanks

## 2013-10-21 NOTE — Telephone Encounter (Signed)
Patient notified of result & advisement. 

## 2013-10-21 NOTE — Procedures (Signed)
ELECTROENCEPHALOGRAM REPORT  Dates of Recording: 10/14/2013 to 10/15/2013  Patient's Name: Mackenzie Gray MRN: [id number] Date of Birth: 1965/08/09  Referring Provider: Dr. Patrcia DollyKaren Aquino  Procedure: 24-hour ambulatory EEG  History: This is a 48 year old woman with an episode of transient right-sided symptoms with slurred speech and associated headache. She also reports olfactory hallucinations  Medications: HCTZ, Estrace  Technical Summary: This is a 24-hour multichannel digital EEG recording measured by the international 10-20 system with electrodes applied with paste and impedances below 5000 ohms performed as portable with EKG monitoring.  The digital EEG was referentially recorded, reformatted, and digitally filtered in a variety of bipolar and referential montages for optimal display.    DESCRIPTION OF RECORDING: During maximal wakefulness, the background activity consisted of a symmetric 10 Hz posterior dominant rhythm which was reactive to eye opening.  There were no epileptiform discharges or focal slowing seen in wakefulness.  During the recording, the patient progresses through wakefulness, drowsiness, and Stage 2 sleep.  During drowsiness and sleep, there is an increase in theta and delta slowing, at times with shifting asymmetry over the bilateral temporal regions. Again, there were no epileptiform discharges seen.  Events: There were no push button events. Patient reported headache on 8/12 at 1635 hours, 1745 hours, 1815 hours, and 8/13 morning, with no associated EEG or EKG changes seen.  There were no electrographic seizures seen.  EKG lead was unremarkable.  IMPRESSION: This 24-hour ambulatory EEG study is normal.    CLINICAL CORRELATION: A normal EEG does not exclude a clinical diagnosis of epilepsy.  Right-sided symptoms and olfactory hallucinations were not reported.  Headache did not show EEG change.     Patrcia DollyKaren Aquino, M.D.

## 2013-12-09 ENCOUNTER — Ambulatory Visit (INDEPENDENT_AMBULATORY_CARE_PROVIDER_SITE_OTHER): Payer: BC Managed Care – PPO | Admitting: Nurse Practitioner

## 2013-12-09 ENCOUNTER — Encounter: Payer: Self-pay | Admitting: Nurse Practitioner

## 2013-12-09 VITALS — BP 118/90 | Ht 64.0 in | Wt 208.0 lb

## 2013-12-09 DIAGNOSIS — M5441 Lumbago with sciatica, right side: Secondary | ICD-10-CM

## 2013-12-09 DIAGNOSIS — M722 Plantar fascial fibromatosis: Secondary | ICD-10-CM

## 2013-12-09 MED ORDER — GABAPENTIN 300 MG PO CAPS: 300.0000 mg | ORAL_CAPSULE | Freq: Two times a day (BID) | ORAL | Status: DC

## 2013-12-09 NOTE — Patient Instructions (Signed)
Plantar Fasciitis  Plantar fasciitis is a common condition that causes foot pain. It is soreness (inflammation) of the band of tough fibrous tissue on the bottom of the foot that runs from the heel bone (calcaneus) to the ball of the foot. The cause of this soreness may be from excessive standing, poor fitting shoes, running on hard surfaces, being overweight, having an abnormal walk, or overuse (this is common in runners) of the painful foot or feet. It is also common in aerobic exercise dancers and ballet dancers.  SYMPTOMS   Most people with plantar fasciitis complain of:   Severe pain in the morning on the bottom of their foot especially when taking the first steps out of bed. This pain recedes after a few minutes of walking.   Severe pain is experienced also during walking following a long period of inactivity.   Pain is worse when walking barefoot or up stairs  DIAGNOSIS    Your caregiver will diagnose this condition by examining and feeling your foot.   Special tests such as X-rays of your foot, are usually not needed.  PREVENTION    Consult a sports medicine professional before beginning a new exercise program.   Walking programs offer a good workout. With walking there is a lower chance of overuse injuries common to runners. There is less impact and less jarring of the joints.   Begin all new exercise programs slowly. If problems or pain develop, decrease the amount of time or distance until you are at a comfortable level.   Wear good shoes and replace them regularly.   Stretch your foot and the heel cords at the back of the ankle (Achilles tendon) both before and after exercise.   Run or exercise on even surfaces that are not hard. For example, asphalt is better than pavement.   Do not run barefoot on hard surfaces.   If using a treadmill, vary the incline.   Do not continue to workout if you have foot or joint problems. Seek professional help if they do not improve.  HOME CARE INSTRUCTIONS     Avoid activities that cause you pain until you recover.   Use ice or cold packs on the problem or painful areas after working out.   Only take over-the-counter or prescription medicines for pain, discomfort, or fever as directed by your caregiver.   Soft shoe inserts or athletic shoes with air or gel sole cushions may be helpful.   If problems continue or become more severe, consult a sports medicine caregiver or your own health care provider. Cortisone is a potent anti-inflammatory medication that may be injected into the painful area. You can discuss this treatment with your caregiver.  MAKE SURE YOU:    Understand these instructions.   Will watch your condition.   Will get help right away if you are not doing well or get worse.  Document Released: 11/14/2000 Document Revised: 05/14/2011 Document Reviewed: 01/14/2008  Exit[employer] Patient Information 2015 [employer]. This information is not intended to replace advice given to you by your health care provider. Make sure you discuss any questions you have with your health care provider.

## 2013-12-13 ENCOUNTER — Encounter: Payer: Self-pay | Admitting: Nurse Practitioner

## 2013-12-13 NOTE — Progress Notes (Signed)
Subjective:  Presents with complaints of right low back pain for the past year. Pain is now going down the right hip area to the foot. Continues to have issues with plantar fasciitis, has seen local podiatrist. Has a history of low back problems.  Objective:   BP 118/90  Ht 5\' 4"  (1.626 m)  Wt 208 lb (94.348 kg)  BMI 35.69 kg/m2  NAD. Alert, oriented. No spinal tenderness. Tenderness in the right low lumbar area into the right mid buttock. SLR positive on the right negative on the left. Reflexes normal limit lower extremities. Sensation grossly intact right foot. Strong DP pulse. Normal capillary refill. Gait slow but steady. Distinct tenderness noted along the lateral part of the foot into the heel and arch.  Assessment: Plantar fasciitis - Plan: Ambulatory referral to Podiatry  Right-sided low back pain with right-sided sciatica  Plan:  Meds ordered this encounter  Medications  . gabapentin (NEURONTIN) 300 MG capsule    Sig: Take 1 capsule (300 mg total) by mouth 2 (two) times daily.    Dispense:  60 capsule    Refill:  2    Order Specific Question:  Supervising Provider    Answer:  Merlyn AlbertLUKING, WILLIAM S [[id number]]   Start gabapentin as directed. Feel a good portion of her back pain is coming from change in her gait due to plantar fasciitis. Referral to podiatry. Patient declines flu vaccine. Return if symptoms worsen or fail to improve.

## 2013-12-22 ENCOUNTER — Ambulatory Visit: Payer: BC Managed Care – PPO | Admitting: Podiatry

## 2013-12-22 ENCOUNTER — Ambulatory Visit (INDEPENDENT_AMBULATORY_CARE_PROVIDER_SITE_OTHER): Payer: BC Managed Care – PPO

## 2013-12-22 VITALS — BP 167/97 | HR 90 | Resp 15 | Ht 64.0 in | Wt 208.8 lb

## 2013-12-22 DIAGNOSIS — M79671 Pain in right foot: Secondary | ICD-10-CM

## 2013-12-22 DIAGNOSIS — M7731 Calcaneal spur, right foot: Secondary | ICD-10-CM

## 2013-12-22 DIAGNOSIS — M722 Plantar fascial fibromatosis: Secondary | ICD-10-CM

## 2013-12-22 MED ORDER — MELOXICAM 15 MG PO TABS: 15.0000 mg | ORAL_TABLET | Freq: Every day | ORAL | Status: DC

## 2013-12-22 NOTE — Progress Notes (Signed)
   Subjective:    Patient ID: Mackenzie Gray, female    DOB: 07-Feb-1966, 48 y.o.   MRN: [id number]  HPI Comments: N heel pain L right heel and inferior arch D 1.5 year O possibly injured when stepped down into a hole C stabbing pain A worse after resting and in the morning, long period of standing T ice, epsom salt warm soaks, stretches, [facility] injected B/L heel and the heels worsened.  Foot Pain Associated symptoms include arthralgias, chest pain, congestion, coughing, fatigue, headaches, myalgias, numbness and a rash.      Review of Systems  Constitutional: Positive for appetite change, fatigue and unexpected weight change.  HENT: Positive for congestion, ear pain and sinus pressure.   Eyes: Positive for itching.  Respiratory: Positive for cough.   Cardiovascular: Positive for chest pain.       Calf pain when walking.  Endocrine: Positive for heat intolerance.  Musculoskeletal: Positive for arthralgias, back pain and myalgias.  Skin: Positive for rash.       Nails have grooves  Neurological: Positive for numbness and headaches.  Hematological:       Slow to heal.  All other systems reviewed and are negative.      Objective:   Physical Exam 48 year old white female well-developed well-nourished oriented x3 presents at this time with the inferior heel pain left and right foot previously left foot: That was improved right foot continues to be painful and symptomatic greater than a year and half injured it sometime in the past has stabbing shooting pain worse a rest or getting up in the morning has tried ice Epsom salts soaking stretching has had steroid injections in the heels with seem to be getting worse patient is a history of arthralgia chest pain congestion and cough and headaches review of systems reveal some arthralgias and back problems and myalgias may have a chronic pain type symptomology as well which may need to be addressed. Dorsally objective findings as  follows vascular status reveals pedal pulses palpable DP and PT +2/4 bilateral Refill time 3 seconds all digits epicritic and proprioceptive sensations intact and symmetric bilateral there is normal plantar response DTRs not elicited dermatologically skin color pigment normal hair growth absent nails somewhat criptotic orthopedic exam there is pain on palpation of the medial band plantar fascia bilateral right much more so than left medial calcaneal tubercle mid arch. Painful symptomatic on direct wound and compression lateral compression. Patient Baird LyonsCasey was and nodule lateral heel area cannot rule out a fatty cyst or nodular peas intracranial in the past. No nodules palpated at this time no cyst or tumors or identified x-rays reveal well-developed inferior calcaneal spurring intact cyma line mild fascial thickening noted no signs of fracture or other osseous abnormalities       Assessment & Plan:  Assessment plantar fasciitis/heel spur syndrome right significantly worse than left but this time fascial strapping applied to the right foot recommended crocs for around the house recommended ice to the heel every evening [facility] is given a reappointed 2 weeks for followup may be candidate for orthoses the future based on progress and findings next paragraph Alvan Dameichard Jessee Newnam DPM

## 2013-12-22 NOTE — Patient Instructions (Signed)

## 2014-01-05 ENCOUNTER — Ambulatory Visit: Payer: BC Managed Care – PPO

## 2014-02-12 ENCOUNTER — Encounter: Payer: Self-pay | Admitting: Family Medicine

## 2014-02-12 ENCOUNTER — Ambulatory Visit (INDEPENDENT_AMBULATORY_CARE_PROVIDER_SITE_OTHER): Payer: BC Managed Care – PPO | Admitting: Family Medicine

## 2014-02-12 ENCOUNTER — Telehealth: Payer: Self-pay

## 2014-02-12 VITALS — BP 120/80 | Temp 98.0°F | Ht 64.0 in | Wt 208.0 lb

## 2014-02-12 DIAGNOSIS — R252 Cramp and spasm: Secondary | ICD-10-CM

## 2014-02-12 DIAGNOSIS — J329 Chronic sinusitis, unspecified: Secondary | ICD-10-CM

## 2014-02-12 DIAGNOSIS — J310 Chronic rhinitis: Secondary | ICD-10-CM

## 2014-02-12 MED ORDER — FLUCONAZOLE 150 MG PO TABS: 150.0000 mg | ORAL_TABLET | Freq: Once | ORAL | Status: DC

## 2014-02-12 MED ORDER — DOXYCYCLINE HYCLATE 100 MG PO CAPS: 100.0000 mg | ORAL_CAPSULE | Freq: Two times a day (BID) | ORAL | Status: DC

## 2014-02-12 NOTE — Telephone Encounter (Signed)
Please see letter patient addressed to you about her nephew becoming a patient at our practice.

## 2014-02-12 NOTE — Progress Notes (Signed)
   Subjective:    Patient ID: Mackenzie Gray, female    DOB: January 21, 1966, 48 y.o.   MRN: [id number]  Sinusitis This is a new problem. The current episode started in the past 7 days. The problem is unchanged. The maximum temperature recorded prior to her arrival was 102 - 102.9 F. The pain is moderate. Associated symptoms include congestion, coughing, headaches and sinus pressure. Pertinent negatives include no ear pain or shortness of breath. (Nausea, vomiting, diarrhea) Past treatments include oral decongestants. The treatment provided no relief.   Patient also has a laceration on her finger that she would like the doctor to take a look at.   PMH benign  Review of Systems  Constitutional: Negative for fever and activity change.  HENT: Positive for congestion, rhinorrhea and sinus pressure. Negative for ear pain.   Eyes: Negative for discharge.  Respiratory: Positive for cough. Negative for shortness of breath and wheezing.   Cardiovascular: Negative for chest pain.  Neurological: Positive for headaches.       Objective:   Physical Exam  Constitutional: She appears well-developed.  HENT:  Head: Normocephalic.  Nose: Nose normal.  Mouth/Throat: Oropharynx is clear and moist. No oropharyngeal exudate.  Neck: Neck supple.  Cardiovascular: Normal rate and normal heart sounds.   No murmur heard. Pulmonary/Chest: Effort normal and breath sounds normal. She has no wheezes.  Lymphadenopathy:    She has no cervical adenopathy.  Skin: Skin is warm and dry.  Nursing note and vitals reviewed.  Sinus tenderness Bronchial cough no crackles       Assessment & Plan:  Leg cramps- stretching, mustard, check thyroid  sinusitis-antibiotics prescribed warning signs discuss  Laceration on finger should heal up-to-date on tetanus antibiotic for sinuses should cure this

## 2014-02-23 LAB — BASIC METABOLIC PANEL
BUN: 15 mg/dL (ref 6–23)
CO2: 25 mEq/L (ref 19–32)
Calcium: 9.4 mg/dL (ref 8.4–10.5)
Chloride: 105 mEq/L (ref 96–112)
Creat: 0.68 mg/dL (ref 0.50–1.10)
Glucose, Bld: 90 mg/dL (ref 70–99)
Potassium: 4.8 mEq/L (ref 3.5–5.3)
SODIUM: 138 meq/L (ref 135–145)

## 2014-02-23 LAB — TSH: TSH: 1.285 u[IU]/mL (ref 0.350–4.500)

## 2014-03-03 ENCOUNTER — Telehealth: Payer: Self-pay | Admitting: Family Medicine

## 2014-03-03 NOTE — Telephone Encounter (Signed)
Pt needs new referral for [facility] in [place], Dr Madelaine EtienneSikora  Goes there already just needs a new one for the new year

## 2014-03-22 ENCOUNTER — Ambulatory Visit (INDEPENDENT_AMBULATORY_CARE_PROVIDER_SITE_OTHER): Payer: BC Managed Care – PPO | Admitting: Nurse Practitioner

## 2014-03-22 ENCOUNTER — Ambulatory Visit: Payer: BC Managed Care – PPO | Admitting: Family Medicine

## 2014-03-22 ENCOUNTER — Encounter: Payer: Self-pay | Admitting: Nurse Practitioner

## 2014-03-22 VITALS — BP 128/84 | Ht 64.0 in | Wt 211.0 lb

## 2014-03-22 DIAGNOSIS — Z77120 Contact with and (suspected) exposure to mold (toxic): Secondary | ICD-10-CM

## 2014-03-22 DIAGNOSIS — M545 Low back pain, unspecified: Secondary | ICD-10-CM

## 2014-03-22 DIAGNOSIS — G8929 Other chronic pain: Secondary | ICD-10-CM

## 2014-03-22 DIAGNOSIS — R609 Edema, unspecified: Secondary | ICD-10-CM

## 2014-03-22 MED ORDER — TRIAMTERENE-HCTZ 37.5-25 MG PO CAPS: 1.0000 | ORAL_CAPSULE | Freq: Every day | ORAL | Status: DC

## 2014-03-22 NOTE — Telephone Encounter (Signed)
Checking on the status of this referral

## 2014-03-22 NOTE — Progress Notes (Signed)
Subjective:  Presents for routine follow up. Dyazide helping edema. Persistent cough mainly when she is at home where she has severe black mold. Has a picture on her phone which shows severe black mold over several walls. Rare wheeze. Using albuterol about twice a week. Unable to move at this time. Cough resolves when she is not at home.  Also needs follow up with neurosurgeon for chronic back pain but needs referral.   Objective:   BP 128/84 mmHg  Ht 5\' 4"  (1.626 m)  Wt 211 lb (95.709 kg)  BMI 36.20 kg/m2 NAD. Alert, oriented. TMs mild clear effusion. Pharynx clear. Neck supple with mild adenopathy. Lungs clear. Heart RRR. Lower extremities no edema.   Assessment: Peripheral edema  Mold exposure  Low back pain of over 3 months duration - Plan: Ambulatory referral to Neurosurgery    Plan:  Meds ordered this encounter  Medications  . triamterene-hydrochlorothiazide (DYAZIDE) 37.5-25 MG per capsule    Sig: Take 1 each (1 capsule total) by mouth daily.    Dispense:  30 capsule    Refill:  11    Order Specific Question:  Supervising Provider    Answer:  Merlyn AlbertLUKING, WILLIAM S [[id number]   Return in about 6 months (around 09/20/2014). Reminded about physical.

## 2014-03-23 ENCOUNTER — Ambulatory Visit (INDEPENDENT_AMBULATORY_CARE_PROVIDER_SITE_OTHER): Payer: BC Managed Care – PPO

## 2014-03-23 VITALS — BP 118/76 | HR 89 | Resp 12

## 2014-03-23 DIAGNOSIS — M79671 Pain in right foot: Secondary | ICD-10-CM

## 2014-03-23 DIAGNOSIS — M7731 Calcaneal spur, right foot: Secondary | ICD-10-CM

## 2014-03-23 DIAGNOSIS — M722 Plantar fascial fibromatosis: Secondary | ICD-10-CM

## 2014-03-23 NOTE — Progress Notes (Signed)
   Subjective:    Patient ID: Mackenzie Gray, female    DOB: [birth year], 49 y.o.   MRN: [id number]  HPI ''B/L FOOT STILL HURTING ESPECIALLY THE RT FOOT BUT THE STRAP HELPS A LOT.''  No new findings or systemic changes noted on review Review of Systems   Objective:   Physical Exam   neurovascular status unchanged pedal pulses are palpable continues to have pain medial band plantar fascia medial calcaneal tubercle right more so than left. No new x-rays taken at this time the strapping had significant improvement neurovascular status otherwise intact plan at this time patient candidate for new functional orthoses.     Assessment & Plan:Assessment plantar fasciitis/heel spur syndrome. Fascial strapping applied to both feet after orthotic scan is carried out for new functional orthoses or functional orthotic with extrinsic rear foot posting be ordered full-length top cover patient will be contacted with the next 4 weeks when orthotics ready for fitting   Alvan Dameichard Embrie Mikkelsen DPM

## 2014-03-23 NOTE — Patient Instructions (Signed)

## 2014-04-05 NOTE — Telephone Encounter (Signed)
Referral done, attatched to OV for Dr. Ralene CorkSikora, tried to call to notify pt - no answer & no machine or voicemail to leave a message

## 2014-04-06 ENCOUNTER — Encounter: Payer: Self-pay | Admitting: Family Medicine

## 2014-04-12 ENCOUNTER — Other Ambulatory Visit: Payer: Self-pay | Admitting: Nurse Practitioner

## 2014-04-12 ENCOUNTER — Other Ambulatory Visit: Payer: Self-pay | Admitting: Family Medicine

## 2014-04-12 ENCOUNTER — Telehealth: Payer: Self-pay | Admitting: Family Medicine

## 2014-04-12 NOTE — Telephone Encounter (Signed)
Patient also want Diflucan

## 2014-04-12 NOTE — Telephone Encounter (Signed)
Will send in refill for Doxycyline. Office visit if no better.

## 2014-04-12 NOTE — Telephone Encounter (Signed)
Seen for sinus in Dec and Jan per patient

## 2014-04-12 NOTE — Telephone Encounter (Signed)
Patient notified

## 2014-04-12 NOTE — Telephone Encounter (Signed)
Pt called stating that she still has a sinus infection and is requesting more Doxycycline to be called in. Pt also stated that if the doxycycline is called in  She will also need diflucan because she is prone to yeast infections. Pt can be reached At work until 2 after that you can reach her at [phone].  [facility]

## 2014-04-12 NOTE — Telephone Encounter (Signed)
Nurses: please clarify; note says she "still" has sinus infection. I see where we have seen her just not for that. Thanks.

## 2014-04-13 ENCOUNTER — Encounter: Payer: Self-pay | Admitting: Family Medicine

## 2014-04-13 ENCOUNTER — Ambulatory Visit (INDEPENDENT_AMBULATORY_CARE_PROVIDER_SITE_OTHER): Payer: BC Managed Care – PPO | Admitting: Family Medicine

## 2014-04-13 ENCOUNTER — Other Ambulatory Visit: Payer: Self-pay | Admitting: Nurse Practitioner

## 2014-04-13 VITALS — BP 132/98 | Ht 64.0 in | Wt 212.8 lb

## 2014-04-13 DIAGNOSIS — R079 Chest pain, unspecified: Secondary | ICD-10-CM

## 2014-04-13 MED ORDER — IBUPROFEN 800 MG PO TABS: 800.0000 mg | ORAL_TABLET | Freq: Three times a day (TID) | ORAL | Status: DC | PRN

## 2014-04-13 MED ORDER — ALPRAZOLAM 0.5 MG PO TABS: 0.5000 mg | ORAL_TABLET | Freq: Every day | ORAL | Status: DC | PRN

## 2014-04-13 MED ORDER — FLUCONAZOLE 150 MG PO TABS: ORAL_TABLET | ORAL | Status: DC

## 2014-04-13 NOTE — Telephone Encounter (Signed)
Done

## 2014-04-13 NOTE — Progress Notes (Signed)
   Subjective:    Patient ID: Mackenzie Gray, female    DOB: 1966/01/01, 49 y.o.   MRN: [id number]  Chest Pain  This is a new problem. The current episode started today (about 20 mins ago). The quality of the pain is described as squeezing. Associated symptoms include back pain, headaches, nausea and shortness of breath. Associated symptoms comments: Numbness in fingers.   Fingers and hands went numb  Took breath away  Stabbing pain in chest and back  Thought at fist it was a panic attack  No hx of panic attack  Felt similar to a spell this summmer  Felt disorient ed  No stress and felt gtreat  [place] in [employer]  Not a lot of exercising, rides srtationary bike and tries to walk  No chest pain or pressure with exertion  Thirty day notice this wk, landlord now kicking pt out of the apartment. Under a lot of stress with this understandably. Has not had a panic attack for a long time.  Recent migraine headaches. In the midst of one now. Right-sided frontal radiating to neck sharp. Also throbbing also nausea.  Noted tingling around the tips of fingers both hands and also around what   Review of Systems  Respiratory: Positive for shortness of breath.   Cardiovascular: Positive for chest pain.  Gastrointestinal: Positive for nausea.  Musculoskeletal: Positive for back pain.  Neurological: Positive for headaches.       Objective:   Physical Exam  Alert somewhat tearful vitals stable neck supple fingertips slight diminished sensation lungs clear heart rare rhythm chest wall nontender.  Electrocardiogram normal sinus rhythm no significant ST-T changes      Assessment & Plan:  Impression 1 chest pain highly doubt cardiac origin rationale discussed likely secondary in part to #2 #2 panic attacks with anxiety and hyperventilation #3 paresthesias of the hands related to #2 #4 migraine headaches discussed plan Motrin when necessary. Zofran when necessary. No cardiology  workup rationale discussed. Xanax when necessary for anxiety. Likely source is the impending eviction from current apartment. WSL

## 2014-04-20 ENCOUNTER — Ambulatory Visit (INDEPENDENT_AMBULATORY_CARE_PROVIDER_SITE_OTHER): Payer: BC Managed Care – PPO

## 2014-04-20 ENCOUNTER — Telehealth: Payer: Self-pay | Admitting: Family Medicine

## 2014-04-20 VITALS — BP 139/92 | HR 96 | Resp 12

## 2014-04-20 DIAGNOSIS — M7731 Calcaneal spur, right foot: Secondary | ICD-10-CM

## 2014-04-20 DIAGNOSIS — M722 Plantar fascial fibromatosis: Secondary | ICD-10-CM

## 2014-04-20 DIAGNOSIS — M79671 Pain in right foot: Secondary | ICD-10-CM

## 2014-04-20 MED ORDER — MELOXICAM 15 MG PO TABS: 15.0000 mg | ORAL_TABLET | Freq: Every day | ORAL | Status: DC

## 2014-04-20 NOTE — Telephone Encounter (Signed)
Seen on 04/13/14

## 2014-04-20 NOTE — Progress Notes (Signed)
   Subjective:    Patient ID: Mackenzie Gray, female    DOB: May 08, 1965, 49 y.o.   MRN: [id number]  HPI  ''B/L FOOT ARE DOING MUCH BETTER, BUT STILL HAVE A LITTLE BURNING.''  Review of Systems no new findings or systemic changes noted     Objective:   Physical Exam Patient presents this time for follow-up of plantar fasciitis/heel spur syndrome and orthotic pickup and fitting. One pair of orthotics which fit and contour well to the foot with full contact to the arch is dispensed oral and written instructions for orthotic use and break in are given patient is also given a new prescription for meloxicam at this time. Reappointed in 6-8 weeks for follow-up possible orthotic adjustments in the future as needed       Assessment & Plan:  Assessment plantar fasciitis/heel spur syndrome responded to fascial strapping she responsible response the orthoses which are dispensed and fitted at this time. Recheck in one to 2 months for adjustments if needed a meloxicam on an as-needed basis also elevated ice when possible  Alvan Dameichard Jaloni Sorber DPM

## 2014-04-20 NOTE — Telephone Encounter (Signed)
Pt is calling to say that her pain that she is having in her neck an shoulder is  Getting worse, can't get in to see Lovell SheehanJenkins till April, states she feels her left  Arm is having numbness now due to the neck an shoulder pain. She does not  Want to take anything stronger than the 800 mg Ibuprofen due to her job. What  Can she do to help herself at this point till she goes in to see Lovell SheehanJenkins?   [facility]

## 2014-04-20 NOTE — Telephone Encounter (Signed)
ntsw ov with myself or carolyn later this wk

## 2014-04-20 NOTE — Patient Instructions (Signed)

## 2014-04-20 NOTE — Telephone Encounter (Signed)
Pt states the pain is getting worst. It is painful between her shoulder blades at the back of her skull. Pt is having trouble sleeping d/t the pain. She does not want anything prescribed for the pain. I told her if she did, to call back and we will ask the doc. Pt denies SOB or chest pain. I transferred her up front to schedule OV with Dr. Brett CanalesSteve or Eber Jonesarolyn later this week. Pt verbalized understanding.

## 2014-04-21 ENCOUNTER — Encounter (HOSPITAL_COMMUNITY): Payer: Self-pay | Admitting: *Deleted

## 2014-04-21 ENCOUNTER — Emergency Department (HOSPITAL_COMMUNITY)
Admission: EM | Admit: 2014-04-21 | Discharge: 2014-04-21 | Disposition: A | Discharge status: Home / Self Care | Payer: BC Managed Care – PPO | Attending: Emergency Medicine | Admitting: Emergency Medicine

## 2014-04-21 ENCOUNTER — Emergency Department (HOSPITAL_COMMUNITY): Payer: BC Managed Care – PPO

## 2014-04-21 DIAGNOSIS — M5412 Radiculopathy, cervical region: Secondary | ICD-10-CM | POA: Diagnosis not present

## 2014-04-21 DIAGNOSIS — Z8679 Personal history of other diseases of the circulatory system: Secondary | ICD-10-CM | POA: Diagnosis not present

## 2014-04-21 DIAGNOSIS — Z792 Long term (current) use of antibiotics: Secondary | ICD-10-CM | POA: Insufficient documentation

## 2014-04-21 DIAGNOSIS — Z8719 Personal history of other diseases of the digestive system: Secondary | ICD-10-CM | POA: Insufficient documentation

## 2014-04-21 DIAGNOSIS — Z8639 Personal history of other endocrine, nutritional and metabolic disease: Secondary | ICD-10-CM | POA: Insufficient documentation

## 2014-04-21 DIAGNOSIS — M542 Cervicalgia: Secondary | ICD-10-CM | POA: Diagnosis present

## 2014-04-21 DIAGNOSIS — Z8659 Personal history of other mental and behavioral disorders: Secondary | ICD-10-CM | POA: Diagnosis not present

## 2014-04-21 DIAGNOSIS — Z79899 Other long term (current) drug therapy: Secondary | ICD-10-CM | POA: Insufficient documentation

## 2014-04-21 DIAGNOSIS — Z791 Long term (current) use of non-steroidal anti-inflammatories (NSAID): Secondary | ICD-10-CM | POA: Diagnosis not present

## 2014-04-21 MED ORDER — METHOCARBAMOL 500 MG PO TABS
500.0000 mg | ORAL_TABLET | Freq: Once | ORAL | Status: AC
  Administered 2014-04-21: 500 mg via ORAL
  Filled 2014-04-21: qty 1

## 2014-04-21 MED ORDER — CYCLOBENZAPRINE HCL 10 MG PO TABS: 10.0000 mg | ORAL_TABLET | Freq: Three times a day (TID) | ORAL | Status: DC | PRN

## 2014-04-21 MED ORDER — OXYCODONE-ACETAMINOPHEN 5-325 MG PO TABS
1.0000 | ORAL_TABLET | Freq: Once | ORAL | Status: AC
  Administered 2014-04-21: 1 via ORAL
  Filled 2014-04-21: qty 1

## 2014-04-21 MED ORDER — OXYCODONE-ACETAMINOPHEN 5-325 MG PO TABS: 1.0000 | ORAL_TABLET | ORAL | Status: DC | PRN

## 2014-04-21 MED ORDER — PREDNISONE 10 MG PO TABS: ORAL_TABLET | ORAL | Status: DC

## 2014-04-21 NOTE — ED Notes (Signed)
Pt c/o neck pain and left arm pain radiating down left finger tips. Reports moving boxes last week and each day pain has increased. Left hand grip weak. Denies sob. Denies chest pain. Denies nausea. Pain increases with movement.

## 2014-04-21 NOTE — ED Provider Notes (Signed)
CSN: [id number]     Arrival date & time 04/21/14  1350 History   First MD Initiated Contact with Patient 04/21/14 1614     Chief Complaint  Patient presents with  . Neck Pain  . Tingling     (Consider location/radiation/quality/duration/timing/severity/associated sxs/prior Treatment) Patient is a 49 y.o. female presenting with neck pain.  Neck Pain Associated symptoms: no chest pain, no fever and no headaches     Mackenzie Gray is a 49 y.o. female who presents to the Emergency Department complaining of left sided neck pain for one week.  She describes a sharp, constant, stabbing pain from her left neck across her shoulder and into her left arm and fingers.  It feels "like something is ripping my muscles apart."  She states her fingers are tingling and pain is worse with movement of her neck and raising her arm.  She states that her symptoms began after lifting heavy boxes.  She has been taking Ibuprofen without relief.  She had a cervical fusion in 2004 and states that pain feels similar.  She tried contacting her neurosurgeon, Dr. Lovell SheehanJenkins,  but was unable to see her until April.  She states her left arm feels weak and she now has difficulty grasping objects.  She denies nausea, facial weakness, headaches, chest pain, or shortness of breath.   Past Medical History  Diagnosis Date  . Migraines   . ADD (attention deficit disorder)     Adult  . GERD (gastroesophageal reflux disease)   . IBS (irritable bowel syndrome)   . Hypoestrogenism    Past Surgical History  Procedure Laterality Date  . Abdominal hysterectomy    . Disc removal 5-6 vert    . Esophagogastroduodenoscopy    . Colonoscopy    . Total abdominal hysterectomy w/ bilateral salpingoophorectomy Left    Family History  Problem Relation Age of Onset  . Heart disease    . Arthritis    . Lung disease    . Cancer    . Asthma    . Diabetes    . Kidney disease    . Diabetes Mother     Borderline  . Stroke Father     History  Substance Use Topics  . Smoking status: Never Smoker   . Smokeless tobacco: Not on file  . Alcohol Use: No   OB History    No data available     Review of Systems  Constitutional: Negative for fever and chills.  Eyes: Negative for visual disturbance.  Respiratory: Negative for shortness of breath.   Cardiovascular: Negative for chest pain.  Genitourinary: Negative for dysuria and difficulty urinating.  Musculoskeletal: Positive for neck pain. Negative for joint swelling.  Skin: Negative for color change and wound.  Neurological: Negative for dizziness, seizures, syncope, facial asymmetry and headaches.  Psychiatric/Behavioral: Negative for confusion and decreased concentration.  All other systems reviewed and are negative.     Allergies  Codeine; Morphine; and Other  Home Medications   Prior to Admission medications   Medication Sig Start Date End Date Taking? Authorizing Provider  albuterol (PROVENTIL HFA;VENTOLIN HFA) 108 (90 BASE) MCG/ACT inhaler Inhale 2 puffs into the lungs every 6 (six) hours as needed for wheezing. 03/12/13   Merlyn AlbertWilliam S Luking, MD  ALPRAZolam Prudy Feeler(XANAX) 0.5 MG tablet Take 1 tablet (0.5 mg total) by mouth daily as needed for anxiety. Drowsy precautions 04/13/14   Merlyn AlbertWilliam S Luking, MD  doxycycline (VIBRAMYCIN) 100 MG capsule TAKE ONE CAPSULE BY MOUTH TWICE DAILY.  04/12/14   Merlyn Albert, MD  estradiol (ESTRACE) 2 MG tablet Take 2 mg by mouth daily.    Historical Provider, MD  ibuprofen (ADVIL,MOTRIN) 800 MG tablet Take 1 tablet (800 mg total) by mouth every 8 (eight) hours as needed (for headache). 04/13/14   Merlyn Albert, MD  Melatonin 3 MG TABS Take 3 mg by mouth at bedtime as needed.    Historical Provider, MD  meloxicam (MOBIC) 15 MG tablet Take 1 tablet (15 mg total) by mouth daily. 04/20/14   Alvan Dame, DPM  triamterene-hydrochlorothiazide (DYAZIDE) 37.5-25 MG per capsule Take 1 each (1 capsule total) by mouth daily. 03/22/14 07/16/15   Campbell Riches, NP   BP 136/87 mmHg  Pulse 90  Temp(Src) 98.2 F (36.8 C) (Oral)  Resp 16  Ht  (1.626 m)  Wt 215 lb (97.523 kg)  BMI 36.89 kg/m2  SpO2 99% Physical Exam  Constitutional: She is oriented to person, place, and time. She appears well-developed and well-nourished. No distress.  HENT:  Head: Normocephalic and atraumatic.  Mouth/Throat: Oropharynx is clear and moist.  Eyes: EOM are normal. Pupils are equal, round, and reactive to light.  Neck: Phonation normal. Spinous process tenderness and muscular tenderness present. No rigidity. Decreased range of motion present. No erythema present. No Kernig's sign noted. No thyromegaly present.    Cardiovascular: Normal rate, regular rhythm, normal heart sounds and intact distal pulses.   No murmur heard. Pulmonary/Chest: Effort normal and breath sounds normal. No respiratory distress. She exhibits no tenderness.  Musculoskeletal: She exhibits tenderness. She exhibits no edema.       Cervical back: She exhibits tenderness and bony tenderness. She exhibits normal range of motion, no swelling, no deformity, no spasm and normal pulse.  ttp of the left cervical spine, paraspinal muscles and along the left trapezius muscle.  Grip strength is  equal bilaterally.  Distal sensation intact,  CR < 2 sec.  Pain reproduced with abduction of the left arm.    Lymphadenopathy:    She has no cervical adenopathy.  Neurological: She is alert and oriented to person, place, and time. She has normal strength. No sensory deficit. She exhibits normal muscle tone. Coordination normal.  Reflex Scores:      Tricep reflexes are 2+ on the right side and 2+ on the left side.      Bicep reflexes are 2+ on the right side and 2+ on the left side. Skin: Skin is warm and dry.  Nursing note and vitals reviewed.   ED Course  Procedures (including critical care time) Labs Review Labs Reviewed - No data to display  Imaging Review Ct Cervical Spine Wo  Contrast  04/21/2014   CLINICAL DATA:  Neck pain radiating to left arm  EXAM: CT CERVICAL SPINE WITHOUT CONTRAST  TECHNIQUE: Multidetector CT imaging of the cervical spine was performed without intravenous contrast. Multiplanar CT image reconstructions were also generated.  COMPARISON:  None.  FINDINGS: Reversal the normal cervical lordosis.  No evidence of fracture or dislocation. Vertebral body heights are maintained. Dens appears intact.  No prevertebral soft tissue swelling.  C5-6 ACDF, without evidence of complication.  Mild degenerative changes at C4-5 and C6-7.  Visualized thyroid is unremarkable.  Visualized lung apices are clear.  IMPRESSION: No fracture or dislocation is seen.  C5-6 ACDF, without evidence of complication.  Mild degenerative changes.   Electronically Signed   By: Charline Bills M.D.   On: 04/21/2014 17:47     EKG  Interpretation None      MDM   Final diagnoses:  Cervical radiculopathy    Pt is well appearing.  Left sided neck pain and likely cervical radiculopathy.  No concerning sx's for emergent neurological process.  Pt is NV intact, pain is reproducible with arm movement.  Has appt with PMD.  Appears stable for d/c    Regis Wiland L. Trisha Mangle, PA-C 04/23/14 0038  Samuel Jester, DO 04/24/14 1610

## 2014-04-21 NOTE — ED Notes (Signed)
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 Pt states constant pain to left arm, states, "feel like someone's ripping my muscles apart." States tingling to the fingertips. Earlier, pt states she was unable to grasp anything.

## 2014-04-21 NOTE — Discharge Instructions (Signed)
Cervical Radiculopathy °Cervical radiculopathy means a nerve in the neck is pinched or bruised. This can cause pain or loss of feeling (numbness) that runs from your neck to your arm and fingers. °HOME CARE  °· Put ice on the injured or painful area. °¨ Put ice in a plastic bag. °¨ Place a towel between your skin and the bag. °¨ Leave the ice on for 15-20 minutes, 03-04 times a day, or as told by your doctor. °· If ice does not help, you can try using heat. Take a warm shower or bath, or use a hot water bottle as told by your doctor. °· You may try a gentle neck and shoulder massage. °· Use a flat pillow when you sleep. °· Only take medicines as told by your doctor. °· Keep all physical therapy visits as told by your doctor. °· If you are given a soft collar, wear it as told by your doctor. °GET HELP RIGHT AWAY IF:  °· Your pain gets worse and is not controlled with medicine. °· You lose feeling or feel weak in your hand, arm, face, or leg. °· You have a fever or stiff neck. °· You cannot control when you poop or pee (incontinence). °· You have trouble with walking, balance, or speaking. °MAKE SURE YOU:  °· Understand these instructions. °· Will watch your condition. °· Will get help right away if you are not doing well or get worse. °Document Released: 02/08/2011 Document Revised: 05/14/2011 Document Reviewed: 02/08/2011 °ExitCare® Patient Information ©2015 [employer]. This information is not intended to replace advice given to you by your health care provider. Make sure you discuss any questions you have with your health care provider. ° °

## 2014-04-21 NOTE — ED Notes (Signed)
nad noted prior to dc. Dc instructions reviewed. 3 rx given to pt.

## 2014-04-22 ENCOUNTER — Ambulatory Visit (INDEPENDENT_AMBULATORY_CARE_PROVIDER_SITE_OTHER): Payer: BC Managed Care – PPO | Admitting: Family Medicine

## 2014-04-22 ENCOUNTER — Encounter: Payer: Self-pay | Admitting: Family Medicine

## 2014-04-22 VITALS — BP 130/82 | Temp 98.2°F | Ht 64.0 in | Wt 213.0 lb

## 2014-04-22 DIAGNOSIS — G542 Cervical root disorders, not elsewhere classified: Secondary | ICD-10-CM | POA: Diagnosis not present

## 2014-04-22 DIAGNOSIS — M25512 Pain in left shoulder: Secondary | ICD-10-CM | POA: Diagnosis not present

## 2014-04-22 DIAGNOSIS — M542 Cervicalgia: Secondary | ICD-10-CM | POA: Diagnosis not present

## 2014-04-22 MED ORDER — TRAMADOL HCL 50 MG PO TABS: 50.0000 mg | ORAL_TABLET | Freq: Every evening | ORAL | Status: DC | PRN

## 2014-04-22 NOTE — Progress Notes (Signed)
   Subjective:    Patient ID: Mackenzie Gray, female    DOB: 06-14-1965, 49 y.o.   MRN: [id number]  Shoulder Pain  The pain is present in the left shoulder. This is a new problem. The current episode started 1 to 4 weeks ago. The problem occurs constantly. The problem has been unchanged. The quality of the pain is described as aching. The pain is at a severity of 10/10. The pain is moderate. Associated symptoms include numbness. The symptoms are aggravated by activity. She has tried rest and heat for the symptoms. The treatment provided no relief.  [facility] for left shoulder pain. Patient states that a CT scan was done.   ER note reviewed. X-ray showed degenerative changes. Prior laminectomy was intact. No evidence of bony disruption.  Patient notes pain is severe. Extends from neck all the way out to hand. Primarily the lateral part of the hand. Tooth achy in nature. Patient cannot tolerate any narcotics. Needs to keep working. Very frustrated and tearful at the pain.  Patient states that she has no other concerns at this time.    Review of Systems  Neurological: Positive for numbness.   No headache no chest pain no sciatica no abdominal pain ROS otherwise negative    Objective:   Physical Exam Alert substantial distress. Vitals stable H&T normal left hand grip question diminished. Lateral hand diminished sensation. Upper arm strength intact. Extreme pain with lateral motion of neck.       Assessment & Plan:  Impression cervical neuropathy and very painful to patient with history of prior cervical surgery. Plan neurosurgery referral. They will surely 1 an MRI of the adnexa we will order this. Pain medicine and work realities discussed WSL

## 2014-04-23 ENCOUNTER — Telehealth: Payer: Self-pay | Admitting: Family Medicine

## 2014-04-23 NOTE — Telephone Encounter (Signed)
Pt would like WE for all of next week, please. Thanks!

## 2014-04-23 NOTE — Telephone Encounter (Signed)
Patient seen on 04/22/14 for cervical neuropathy.  She says that her mother wants to know if she should wait to go back to work until after Zella BallRobin completes her MRI?

## 2014-04-23 NOTE — Telephone Encounter (Signed)
It varies from person to person based on what they think they can do and whether they can do their job, if wants w e next wk, happy to do, cerv neuropathy is quite painful

## 2014-04-26 ENCOUNTER — Telehealth: Payer: Self-pay | Admitting: Family Medicine

## 2014-04-26 ENCOUNTER — Encounter: Payer: Self-pay | Admitting: Family Medicine

## 2014-04-26 NOTE — Telephone Encounter (Signed)
Pt states that she now has a raised knot on her back. Pt also states that None of the meds are working. Pt wants to know what the next steps are.

## 2014-04-26 NOTE — Telephone Encounter (Signed)
Tramadol 50 numb 36 one q 6 prn pain, how is MRI order going?, How is neurosurg ref going?

## 2014-04-27 MED ORDER — TRAMADOL HCL 50 MG PO TABS: 50.0000 mg | ORAL_TABLET | Freq: Four times a day (QID) | ORAL | Status: DC | PRN

## 2014-04-27 NOTE — Telephone Encounter (Signed)
Script was faxed to [facility] for Tramadol. MRI is scheduled for 04/29/14 at 8 am. Mackenzie Gray is still working on referral to neurosurgery. Patient was notified.

## 2014-04-29 ENCOUNTER — Telehealth: Payer: Self-pay

## 2014-04-29 ENCOUNTER — Telehealth: Payer: Self-pay | Admitting: Family Medicine

## 2014-04-29 ENCOUNTER — Encounter: Payer: Self-pay | Admitting: Family Medicine

## 2014-04-29 ENCOUNTER — Ambulatory Visit (HOSPITAL_COMMUNITY)
Admission: RE | Admit: 2014-04-29 | Discharge: 2014-04-29 | Disposition: A | Discharge status: Home / Self Care | Payer: BC Managed Care – PPO | Source: Ambulatory Visit | Attending: Family Medicine | Admitting: Family Medicine

## 2014-04-29 DIAGNOSIS — M5021 Other cervical disc displacement,  high cervical region: Secondary | ICD-10-CM | POA: Diagnosis not present

## 2014-04-29 DIAGNOSIS — M5022 Other cervical disc displacement, mid-cervical region: Secondary | ICD-10-CM | POA: Insufficient documentation

## 2014-04-29 DIAGNOSIS — M25512 Pain in left shoulder: Secondary | ICD-10-CM | POA: Diagnosis not present

## 2014-04-29 DIAGNOSIS — M542 Cervicalgia: Secondary | ICD-10-CM | POA: Diagnosis present

## 2014-04-29 DIAGNOSIS — M2578 Osteophyte, vertebrae: Secondary | ICD-10-CM | POA: Insufficient documentation

## 2014-04-29 NOTE — Telephone Encounter (Signed)
Ok w e two wks

## 2014-04-29 NOTE — Telephone Encounter (Signed)
Patient states that she needs that note to say that she cannot lift over 5 lbs and for how long. Patient states that the ibuprofen is not working, tramadol is making her throat swell up and she needs more flexeril. Patient states that she needs something else prescribed for the pain.

## 2014-04-29 NOTE — Telephone Encounter (Signed)
Done

## 2014-04-29 NOTE — Telephone Encounter (Signed)
i wrote out completely, if pt doew not want that, ask her exactly what she wants as fr as limitations, srite it and give it to her

## 2014-04-29 NOTE — Telephone Encounter (Signed)
Pt states her employer is insisting that she return to work on Monday 05/03/14, states she can pick up note tomorrow, work excuse needs to specify restrictions, please call pt when done

## 2014-04-29 NOTE — Telephone Encounter (Signed)
Patient needs work excuse extended due to her shoulder and neck pain. Mackenzie Gray is working on referral to neurosurgeon and patient is aware of this.

## 2014-04-30 ENCOUNTER — Encounter: Payer: Self-pay | Admitting: Nurse Practitioner

## 2014-04-30 ENCOUNTER — Ambulatory Visit (INDEPENDENT_AMBULATORY_CARE_PROVIDER_SITE_OTHER): Payer: BC Managed Care – PPO | Admitting: Nurse Practitioner

## 2014-04-30 VITALS — BP 142/90 | Ht 64.0 in | Wt 213.0 lb

## 2014-04-30 DIAGNOSIS — M5412 Radiculopathy, cervical region: Secondary | ICD-10-CM

## 2014-04-30 MED ORDER — AMITRIPTYLINE HCL 10 MG PO TABS: 10.0000 mg | ORAL_TABLET | Freq: Every day | ORAL | Status: DC

## 2014-05-04 ENCOUNTER — Encounter: Payer: Self-pay | Admitting: Nurse Practitioner

## 2014-05-04 NOTE — Progress Notes (Signed)
Subjective:  Presents for recheck of her neck pain. Her referral has been made. Is waiting on appointment from her neurosurgeon. Was unable to take tramadol, began having a dry throat with a sensation of swelling. Also hot flashes. Unable to take gabapentin due to nausea. Pain is 9-10/10 on a pain scale most of the time. Does not want to take narcotic pain medication.   Objective:   BP 142/90 mmHg  Ht 5\' 4"  (1.626 m)  Wt 213 lb (96.616 kg)  BMI 36.54 kg/m2 NAD. Alert, oriented. Lungs clear. Heart regular rate rhythm.  Assessment:  Problem List Items Addressed This Visit      Nervous and Auditory   Neuropathy, cervical (radicular) - Primary     Plan:  Meds ordered this encounter  Medications  . amitriptyline (ELAVIL) 10 MG tablet    Sig: Take 1 tablet (10 mg total) by mouth at bedtime.    Dispense:  60 tablet    Refill:  0    PLEASE DELIVER    Order Specific Question:  Supervising Provider    Answer:  Merlyn AlbertLUKING, WILLIAM S [[id number]]   Discussed options with patient. Agrees to try amitriptyline as directed. Start with 1 tablet at bedtime, if tolerated and needed increased to 2 tablets at bedtime after one week. Call back if any adverse effects.

## 2014-05-12 ENCOUNTER — Telehealth: Payer: Self-pay | Admitting: Family Medicine

## 2014-05-12 ENCOUNTER — Other Ambulatory Visit: Payer: Self-pay | Admitting: Nurse Practitioner

## 2014-05-12 MED ORDER — IBUPROFEN 800 MG PO TABS: 800.0000 mg | ORAL_TABLET | Freq: Three times a day (TID) | ORAL | Status: DC | PRN

## 2014-05-12 NOTE — Telephone Encounter (Signed)
Pt is requesting a refill on her 800 mg ibuprofen.   [facility]

## 2014-06-04 DIAGNOSIS — Z029 Encounter for administrative examinations, unspecified: Secondary | ICD-10-CM

## 2014-06-14 ENCOUNTER — Ambulatory Visit (INDEPENDENT_AMBULATORY_CARE_PROVIDER_SITE_OTHER): Payer: BC Managed Care – PPO | Admitting: Nurse Practitioner

## 2014-06-14 ENCOUNTER — Encounter: Payer: Self-pay | Admitting: Nurse Practitioner

## 2014-06-14 VITALS — BP 122/88 | Temp 98.0°F | Ht 64.0 in | Wt 209.0 lb

## 2014-06-14 DIAGNOSIS — J069 Acute upper respiratory infection, unspecified: Secondary | ICD-10-CM | POA: Diagnosis not present

## 2014-06-14 DIAGNOSIS — B9689 Other specified bacterial agents as the cause of diseases classified elsewhere: Secondary | ICD-10-CM

## 2014-06-14 MED ORDER — AZITHROMYCIN 250 MG PO TABS: ORAL_TABLET | ORAL | Status: DC

## 2014-06-19 ENCOUNTER — Encounter: Payer: Self-pay | Admitting: Nurse Practitioner

## 2014-06-19 NOTE — Progress Notes (Signed)
Subjective:  Presents for c/o low grade fever and cough x 2 days. Has taken several doses of Doxycyline with no relief. Recently had neck surgery. Max temp 100. Sore throat. Maxillary area headache. Producing brown green sputum.   Objective:   BP 122/88 mmHg  Temp(Src) 98 F (36.7 C)  Ht 5\' 4"  (1.626 m)  Wt 209 lb (94.802 kg)  BMI 35.86 kg/m2 NAD. Alert, oriented. TMs clear effusion. Pharynx injected with PND noted. Neck supple with mild anterior adenopathy. Lungs clear. Heart RRR.   Assessment: Bacterial upper respiratory infection  Plan:  Meds ordered this encounter  Medications  . Oxycodone-Acetaminophen (PERCOCET PO)    Sig: Take by mouth.  . Diazepam (VALIUM PO)    Sig: Take by mouth.  Marland Kitchen. azithromycin (ZITHROMAX Z-PAK) 250 MG tablet    Sig: Take 2 tablets (500 mg) on  Day 1,  followed by 1 tablet (250 mg) once daily on Days 2 through 5.    Dispense:  6 each    Refill:  0    Order Specific Question:  Supervising Provider    Answer:  Merlyn AlbertLUKING, WILLIAM S [[id number]   Continue Mucinex as directed. Continue DB and cough to clear airways. Call back if worsens or persists.

## 2014-06-30 ENCOUNTER — Encounter: Payer: Self-pay | Admitting: Nurse Practitioner

## 2014-06-30 ENCOUNTER — Ambulatory Visit (INDEPENDENT_AMBULATORY_CARE_PROVIDER_SITE_OTHER): Payer: BC Managed Care – PPO | Admitting: Nurse Practitioner

## 2014-06-30 VITALS — BP 128/84 | Ht 63.5 in | Wt 206.8 lb

## 2014-06-30 DIAGNOSIS — Z0000 Encounter for general adult medical examination without abnormal findings: Secondary | ICD-10-CM

## 2014-06-30 DIAGNOSIS — Z1231 Encounter for screening mammogram for malignant neoplasm of breast: Secondary | ICD-10-CM | POA: Diagnosis not present

## 2014-06-30 DIAGNOSIS — Z01419 Encounter for gynecological examination (general) (routine) without abnormal findings: Secondary | ICD-10-CM

## 2014-07-01 ENCOUNTER — Encounter: Payer: Self-pay | Admitting: Nurse Practitioner

## 2014-07-01 NOTE — Progress Notes (Signed)
   Subjective:    Patient ID: Mackenzie Gray, female    DOB: Aug 04, 1965, 49 y.o.   MRN: [id number]  HPI presents for her wellness exam. Healthy diet. Regular exercise. Regular eye exams. Needs dental exam. Off Estrace since February. No major problems stopping med. Same sexual partner.     Review of Systems  Constitutional: Negative for fever, activity change, appetite change and fatigue.  HENT: Positive for congestion, postnasal drip and rhinorrhea. Negative for dental problem, ear pain, sinus pressure and sore throat.   Respiratory: Positive for cough. Negative for chest tightness, shortness of breath and wheezing.        Slight cough  Cardiovascular: Negative for chest pain.  Gastrointestinal: Negative for nausea, vomiting, abdominal pain, diarrhea, constipation, blood in stool and abdominal distention.  Genitourinary: Negative for dysuria, urgency, frequency, vaginal discharge, enuresis, difficulty urinating, genital sores and pelvic pain.       Objective:   Physical Exam  Constitutional: She is oriented to person, place, and time. She appears well-developed. No distress.  HENT:  Right Ear: External ear normal.  Left Ear: External ear normal.  Mouth/Throat: Oropharynx is clear and moist.  TMs mildly retracted; no erythema. Pharynx clear.  Neck: Normal range of motion. Neck supple. No tracheal deviation present. No thyromegaly present.  Cardiovascular: Normal rate, regular rhythm and normal heart sounds.  Exam reveals no gallop.   No murmur heard. Pulmonary/Chest: Effort normal and breath sounds normal.  Abdominal: Soft. She exhibits no distension. There is no tenderness.  Genitourinary: Vagina normal. No vaginal discharge found.  External GU: no rashes or lesions. Vagina: no discharge. Bimanual exam: no tenderness or masses.  Musculoskeletal: She exhibits no edema.  Lymphadenopathy:    She has no cervical adenopathy.  Neurological: She is alert and oriented to person, place, and  time.  Skin: Skin is warm and dry. No rash noted.  Psychiatric: She has a normal mood and affect. Her behavior is normal.  Breast exam: no masses; axillae no adenopathy. See routine labs July 2015.      Assessment & Plan:  Well woman exam  Visit for screening mammogram - Plan: MM DIGITAL SCREENING BILATERAL  Recommend weight loss and daily vitamin D/calcium supplement. Return in about 6 months (around 12/30/2014) for recheck.

## 2014-07-14 ENCOUNTER — Ambulatory Visit (HOSPITAL_COMMUNITY)
Admission: RE | Admit: 2014-07-14 | Discharge: 2014-07-14 | Disposition: A | Discharge status: Home / Self Care | Payer: BC Managed Care – PPO | Source: Ambulatory Visit | Attending: Nurse Practitioner | Admitting: Nurse Practitioner

## 2014-07-14 DIAGNOSIS — Z1231 Encounter for screening mammogram for malignant neoplasm of breast: Secondary | ICD-10-CM | POA: Diagnosis not present

## 2014-07-15 ENCOUNTER — Ambulatory Visit (HOSPITAL_COMMUNITY): Payer: BC Managed Care – PPO

## 2014-07-16 ENCOUNTER — Ambulatory Visit (INDEPENDENT_AMBULATORY_CARE_PROVIDER_SITE_OTHER): Payer: BC Managed Care – PPO | Admitting: Nurse Practitioner

## 2014-07-16 ENCOUNTER — Encounter: Payer: Self-pay | Admitting: Nurse Practitioner

## 2014-07-16 VITALS — BP 118/88 | Ht 64.0 in | Wt 210.0 lb

## 2014-07-16 DIAGNOSIS — G5691 Unspecified mononeuropathy of right upper limb: Secondary | ICD-10-CM | POA: Diagnosis not present

## 2014-07-16 DIAGNOSIS — G5690 Unspecified mononeuropathy of unspecified upper limb: Secondary | ICD-10-CM | POA: Insufficient documentation

## 2014-07-16 MED ORDER — ALPRAZOLAM 0.5 MG PO TABS: 0.5000 mg | ORAL_TABLET | Freq: Every day | ORAL | Status: DC | PRN

## 2014-07-16 MED ORDER — TRIAMTERENE-HCTZ 37.5-25 MG PO CAPS: 1.0000 | ORAL_CAPSULE | Freq: Every day | ORAL | Status: DC

## 2014-07-16 MED ORDER — ESTRADIOL 2 MG PO TABS: 2.0000 mg | ORAL_TABLET | Freq: Every day | ORAL | Status: DC

## 2014-07-16 NOTE — Progress Notes (Signed)
Subjective:  Presents for complaints of persistent numbness and tingling in the left hand. Has seen her neurosurgeon following her neck surgery. All of her neuropathic symptoms have resolved except for this. Was told to wait one more month but symptoms have not resolved. Symptoms involve the thumb index and middle fingers of the left hand. Weakness. Hyperesthesia at times. Difficulty gripping items. 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 Limited driving now but if she grips steering wheel more than 10-15 minutes; her hand draws up and she looses her grip. No shoulder or elbow pain.  Objective:   BP 118/88 mmHg  Ht 5\' 4"  (1.626 m)  Wt 210 lb (95.255 kg)  BMI 36.03 kg/m2 NAD. Alert, oriented. Left hand: Strong radial pulse. Fingers warm with good capillary refill. Diminished sensation in the left hand. Hand strength 5+ right, 3+ left. Tinel's test negative. Phalen test positive.  Assessment:  Problem List Items Addressed This Visit      Nervous and Auditory   Neuropathy of hand - Primary persistent    Relevant Medications   ALPRAZolam (XANAX) 0.5 MG tablet   amitriptyline (ELAVIL) 10 MG tablet   cyclobenzaprine (FLEXERIL) 10 MG tablet   Other Relevant Orders   Ambulatory referral to Orthopedic Surgery      Plan:  Meds ordered this encounter  Medications  . ALPRAZolam (XANAX) 0.5 MG tablet    Sig: Take 1 tablet (0.5 mg total) by mouth daily as needed for anxiety. Drowsy precautions    Dispense:  18 tablet    Refill:  0    Order Specific Question:  Supervising Provider    Answer:  Merlyn AlbertLUKING, WILLIAM S [[id number]]  . estradiol (ESTRACE) 2 MG tablet    Sig: Take 1 tablet (2 mg total) by mouth daily.    Dispense:  30 tablet    Refill:  11    Order Specific Question:  Supervising Provider    Answer:  Merlyn AlbertLUKING, WILLIAM S [[id number]]  . triamterene-hydrochlorothiazide (DYAZIDE) 37.5-25 MG per capsule    Sig: Take 1 each (1 capsule total) by mouth daily.    Dispense:  30 capsule   Refill:  5    Order Specific Question:  Supervising Provider    Answer:  Merlyn AlbertLUKING, WILLIAM S [[id number]]  . amitriptyline (ELAVIL) 10 MG tablet    Sig:   . cyclobenzaprine (FLEXERIL) 10 MG tablet    Sig:    Refer to hand specialist for further evaluation. Given prescription for hand brace. At end of visit patient requested refills on her regular medications. Recheck here as needed.

## 2014-07-29 ENCOUNTER — Encounter: Payer: Self-pay | Admitting: Family Medicine

## 2014-08-04 ENCOUNTER — Emergency Department (HOSPITAL_COMMUNITY): Payer: BC Managed Care – PPO

## 2014-08-04 ENCOUNTER — Encounter (HOSPITAL_COMMUNITY): Payer: Self-pay | Admitting: *Deleted

## 2014-08-04 ENCOUNTER — Emergency Department (HOSPITAL_COMMUNITY)
Admission: EM | Admit: 2014-08-04 | Discharge: 2014-08-04 | Disposition: A | Discharge status: Home / Self Care | Payer: BC Managed Care – PPO | Attending: Emergency Medicine | Admitting: Emergency Medicine

## 2014-08-04 DIAGNOSIS — Z8719 Personal history of other diseases of the digestive system: Secondary | ICD-10-CM | POA: Insufficient documentation

## 2014-08-04 DIAGNOSIS — M542 Cervicalgia: Secondary | ICD-10-CM | POA: Diagnosis not present

## 2014-08-04 DIAGNOSIS — R200 Anesthesia of skin: Secondary | ICD-10-CM | POA: Insufficient documentation

## 2014-08-04 DIAGNOSIS — Z79899 Other long term (current) drug therapy: Secondary | ICD-10-CM | POA: Insufficient documentation

## 2014-08-04 DIAGNOSIS — M25519 Pain in unspecified shoulder: Secondary | ICD-10-CM | POA: Insufficient documentation

## 2014-08-04 DIAGNOSIS — E2839 Other primary ovarian failure: Secondary | ICD-10-CM | POA: Insufficient documentation

## 2014-08-04 DIAGNOSIS — Z8679 Personal history of other diseases of the circulatory system: Secondary | ICD-10-CM | POA: Insufficient documentation

## 2014-08-04 DIAGNOSIS — Z8659 Personal history of other mental and behavioral disorders: Secondary | ICD-10-CM | POA: Diagnosis not present

## 2014-08-04 DIAGNOSIS — M5412 Radiculopathy, cervical region: Secondary | ICD-10-CM

## 2014-08-04 DIAGNOSIS — M5090 Cervical disc disorder, unspecified, unspecified cervical region: Secondary | ICD-10-CM

## 2014-08-04 DIAGNOSIS — M545 Low back pain: Secondary | ICD-10-CM | POA: Diagnosis present

## 2014-08-04 LAB — CBC WITH DIFFERENTIAL/PLATELET
Basophils Absolute: 0 10*3/uL (ref 0.0–0.1)
Basophils Relative: 1 % (ref 0–1)
Eosinophils Absolute: 0.1 10*3/uL (ref 0.0–0.7)
Eosinophils Relative: 2 % (ref 0–5)
HCT: 40.3 % (ref 36.0–46.0)
Hemoglobin: 13.9 g/dL (ref 12.0–15.0)
Lymphocytes Relative: 25 % (ref 12–46)
Lymphs Abs: 1.7 10*3/uL (ref 0.7–4.0)
MCH: 30.6 pg (ref 26.0–34.0)
MCHC: 34.5 g/dL (ref 30.0–36.0)
MCV: 88.8 fL (ref 78.0–100.0)
MONOS PCT: 8 % (ref 3–12)
Monocytes Absolute: 0.5 10*3/uL (ref 0.1–1.0)
NEUTROS ABS: 4.3 10*3/uL (ref 1.7–7.7)
Neutrophils Relative %: 64 % (ref 43–77)
PLATELETS: 245 10*3/uL (ref 150–400)
RBC: 4.54 MIL/uL (ref 3.87–5.11)
RDW: 13.3 % (ref 11.5–15.5)
WBC: 6.6 10*3/uL (ref 4.0–10.5)

## 2014-08-04 LAB — BASIC METABOLIC PANEL
ANION GAP: 8 (ref 5–15)
BUN: 11 mg/dL (ref 6–20)
CALCIUM: 8.9 mg/dL (ref 8.9–10.3)
CO2: 23 mmol/L (ref 22–32)
Chloride: 107 mmol/L (ref 101–111)
Creatinine, Ser: 0.76 mg/dL (ref 0.44–1.00)
GFR calc non Af Amer: >60 mL/min (ref >60)
GLUCOSE: 92 mg/dL (ref 65–99)
POTASSIUM: 4.4 mmol/L (ref 3.5–5.1)
SODIUM: 138 mmol/L (ref 135–145)

## 2014-08-04 MED ORDER — GADOBENATE DIMEGLUMINE 529 MG/ML IV SOLN
15.0000 mL | Freq: Once | INTRAVENOUS | Status: AC | PRN
  Administered 2014-08-04: 15 mL via INTRAVENOUS

## 2014-08-04 MED ORDER — IBUPROFEN 600 MG PO TABS: 600.0000 mg | ORAL_TABLET | Freq: Three times a day (TID) | ORAL | Status: DC | PRN

## 2014-08-04 MED ORDER — PREDNISONE 10 MG PO TABS: 60.0000 mg | ORAL_TABLET | Freq: Every day | ORAL | Status: DC

## 2014-08-04 MED ORDER — LORAZEPAM 2 MG/ML IJ SOLN
1.0000 mg | Freq: Once | INTRAMUSCULAR | Status: AC
  Administered 2014-08-04: 1 mg via INTRAVENOUS
  Filled 2014-08-04: qty 1

## 2014-08-04 MED ORDER — PREDNISONE 20 MG PO TABS
60.0000 mg | ORAL_TABLET | Freq: Once | ORAL | Status: AC
  Administered 2014-08-04: 60 mg via ORAL
  Filled 2014-08-04: qty 3

## 2014-08-04 MED ORDER — HYDROMORPHONE HCL 1 MG/ML IJ SOLN
1.0000 mg | Freq: Once | INTRAMUSCULAR | Status: AC
  Administered 2014-08-04: 1 mg via INTRAVENOUS
  Filled 2014-08-04: qty 1

## 2014-08-04 MED ORDER — OXYCODONE-ACETAMINOPHEN 5-325 MG PO TABS: 1.0000 | ORAL_TABLET | ORAL | Status: DC | PRN

## 2014-08-04 MED ORDER — OXYCODONE-ACETAMINOPHEN 5-325 MG PO TABS
2.0000 | ORAL_TABLET | Freq: Once | ORAL | Status: AC
Start: 2014-08-04 — End: 2014-08-04
  Administered 2014-08-04: 2 via ORAL
  Filled 2014-08-04: qty 2

## 2014-08-04 NOTE — ED Notes (Signed)
Pt back from MRI 

## 2014-08-04 NOTE — ED Notes (Signed)
Pt stated that she has been dealing with pain in her shoulders and back for several weeks. Associated with numbness in finger tips, left hand and left side of face.  She stated that this happened before and after surgery for a protruding disc.

## 2014-08-04 NOTE — ED Notes (Signed)
Pt given sprite per request after MD approval.

## 2014-08-04 NOTE — ED Provider Notes (Signed)
CSN: [id number]     Arrival date & time 08/04/14  1324 History   First MD Initiated Contact with Patient 08/04/14 [phone]     Chief Complaint  Patient presents with  . Shoulder Pain  . Back Pain  . Numbness    HPI Patient presents with ongoing paresthesias of her arms and now her toes without weakness of her arms or legs.  She states she had an anterior cervical fusion of C6 and 7 in April 2016 by Dr. Delma Officer.  She reports her left arm numbness never really resolved but now she is developing worsening numbness and tingling in both of her arms and hands.  She denies weakness of her arms or legs.  No fevers or chills.  She does report some increasing neck pain.  She also reports some numbness in the left side of her face.  She reports she called her neurosurgeon's office who recommended that she come to the ER for evaluation.   Past Medical History  Diagnosis Date  . Migraines   . ADD (attention deficit disorder)     Adult  . GERD (gastroesophageal reflux disease)   . IBS (irritable bowel syndrome)   . Hypoestrogenism    Past Surgical History  Procedure Laterality Date  . Abdominal hysterectomy    . Disc removal 5-6 vert    . Esophagogastroduodenoscopy    . Colonoscopy    . Total abdominal hysterectomy w/ bilateral salpingoophorectomy Left    Family History  Problem Relation Age of Onset  . Heart disease    . Arthritis    . Lung disease    . Cancer    . Asthma    . Diabetes    . Kidney disease    . Diabetes Mother     Borderline  . Hyperlipidemia Mother   . Stroke Mother   . Heart disease Mother   . COPD Mother   . Stroke Father   . Hyperlipidemia Father   . Heart disease Father   . COPD Father    History  Substance Use Topics  . Smoking status: Never Smoker   . Smokeless tobacco: Never Used  . Alcohol Use: No   OB History    No data available     Review of Systems  All other systems reviewed and are negative.     Allergies  Codeine; Morphine; Other;  and Tramadol  Home Medications   Prior to Admission medications   Medication Sig Start Date End Date Taking? Authorizing Provider  albuterol (PROVENTIL HFA;VENTOLIN HFA) 108 (90 BASE) MCG/ACT inhaler Inhale 2 puffs into the lungs every 6 (six) hours as needed for wheezing. 03/12/13  Yes Merlyn Albert, MD  ALPRAZolam Prudy Feeler) 0.5 MG tablet Take 1 tablet (0.5 mg total) by mouth daily as needed for anxiety. Drowsy precautions 07/16/14  Yes Campbell Riches, NP  diazepam (VALIUM) 5 MG tablet Take 2.5-5 mg by mouth as needed for muscle spasms.  06/11/14  Yes Historical Provider, MD  estradiol (ESTRACE) 2 MG tablet Take 1 tablet (2 mg total) by mouth daily. 07/16/14  Yes Campbell Riches, NP  Melatonin 1 MG CAPS Take 1-18 mg by mouth at bedtime as needed (sleep).    Yes Historical Provider, MD  triamterene-hydrochlorothiazide (DYAZIDE) 37.5-25 MG per capsule Take 1 each (1 capsule total) by mouth daily. 07/16/14 11/09/15 Yes Campbell Riches, NP  ibuprofen (ADVIL,MOTRIN) 600 MG tablet Take 1 tablet (600 mg total) by mouth every 8 (eight) hours as  needed. 08/04/14   Azalia Bilis, MD  oxyCODONE-acetaminophen (PERCOCET/ROXICET) 5-325 MG per tablet Take 1 tablet by mouth every 4 (four) hours as needed for severe pain. 08/04/14   Azalia Bilis, MD  predniSONE (DELTASONE) 10 MG tablet Take 6 tablets (60 mg total) by mouth daily. 08/04/14   Azalia Bilis, MD   BP 161/94 mmHg  Pulse 84  Temp(Src) 98.3 F (36.8 C) (Oral)  Resp 15  Ht  (1.626 m)  Wt 215 lb 4.8 oz (97.659 kg)  BMI 36.94 kg/m2  SpO2 95% Physical Exam  Constitutional: She is oriented to person, place, and time. She appears well-developed and well-nourished. No distress.  HENT:  Head: Normocephalic and atraumatic.  Eyes: EOM are normal.  Neck: Normal range of motion.  No cervical spine tenderness  Cardiovascular: Normal rate, regular rhythm and normal heart sounds.   Pulmonary/Chest: Effort normal and breath sounds normal.  Abdominal: Soft.  She exhibits no distension. There is no tenderness.  Musculoskeletal: Normal range of motion.  5 out of 5 strength of bilateral arms and legs.  Neurological: She is alert and oriented to person, place, and time.  Skin: Skin is warm and dry.  Psychiatric: She has a normal mood and affect. Judgment normal.  Nursing note and vitals reviewed.   ED Course  Procedures (including critical care time) Labs Review Labs Reviewed  CBC WITH DIFFERENTIAL/PLATELET  BASIC METABOLIC PANEL    Imaging Review Mr Cervical Spine W Wo Contrast  08/04/2014   CLINICAL DATA:  Neck pain. Symptom duration unspecified, but several weeks.  EXAM: MRI CERVICAL SPINE WITHOUT AND WITH CONTRAST  TECHNIQUE: Multiplanar and multiecho pulse sequences of the cervical spine, to include the craniocervical junction and cervicothoracic junction, were obtained according to standard protocol without and with intravenous contrast.  CONTRAST:  15mL MULTIHANCE GADOBENATE DIMEGLUMINE 529 MG/ML IV SOLN  COMPARISON:  Cervical spine plain films 06/24/2014. MR cervical spine 04/29/2014.  FINDINGS: The patient was unable to remain motionless for the exam. Small or subtle lesions could be overlooked.  Since the previous scan, the patient has undergone C6-7 ACDF with anterior plating. Previous plate at X3-2 was removed. No postoperative complications are evident. Previous disc osteophyte complex at C6-7 on the LEFT is improved.  There is mild straightening of the normal cervical lordosis. There is significant disc pathology at C3-4 and C4-5 both of which result in cord compression described below. No definite abnormal cord signal. Craniocervical junction unremarkable.  Abnormal postcontrast enhancement is confined to of disc pathology described below. No features worrisome for infection. No abnormal cord enhancement.  The individual disc spaces were examined as follows:  C2-3:  Normal.  C3-4: Central and leftward disc protrusion. The protrusion is  partially vascularized with postcontrast enhancement. Central canal stenosis and cord flattening are worse on the LEFT. Canal diameter is significantly narrowed, 6-7 mm. LEFT greater than RIGHT C4 nerve root impingement is likely.  C4-5: Central disc extrusion. Peridiscal enhancement. Moderate cord flattening without abnormal cord signal. Canal diameter significantly narrowed at 6-7 mm. Either C5 nerve root impingement is possible.  C5-6:  Unremarkable post fusion level.  No residual impingement.  C6-7:  Unremarkable post fusion level.  No residual impingement.  C7-T1:  Unremarkable.  Compared with the preoperative study from February, the appearance at C6-7 is improved. The appearance at C4-5 is worsened.  IMPRESSION: Status post recent C6-7 ACDF with anterior plating. No postoperative complications are evident.  Worsening adjacent segment disease at C4-5, now with a central disc  extrusion causing stenosis, cord compression, and potential BILATERAL C5 nerve root impingement.  Significant disc disease at C3-4 is also present with a central and leftward disc protrusion, causing stenosis and LEFT greater than RIGHT C4 nerve root impingement.   Electronically Signed   By: Davonna BellingJohn  Curnes M.D.   On: 08/04/2014 14:51  I personally reviewed the imaging tests through PACS system I reviewed available ER/hospitalization records through the EMR    EKG Interpretation None      MDM   Final diagnoses:  Neck pain  Cervical radiculopathy    3:52 PM Spoke with Dr Delma OfficerJeff Jenkins who personally looked at the MRI from his office.  He requests steroids and pain medication.  He will see the patient in the office next week for follow-up.     Azalia BilisKevin Shadi Larner, MD 08/04/14 [phone]

## 2014-08-04 NOTE — ED Notes (Signed)
PT placed in gown and in bed. Pt monitored by pulse ox, bp cuff, and 5-lead. 

## 2014-08-04 NOTE — ED Notes (Signed)
Pt leaving for MRI.  

## 2014-08-04 NOTE — ED Notes (Signed)
Pt medicate for MRI

## 2014-08-04 NOTE — ED Notes (Signed)
NAD at this time. Pt is stable and leaving with her best friend.

## 2014-08-17 ENCOUNTER — Other Ambulatory Visit: Payer: Self-pay | Admitting: Nurse Practitioner

## 2014-08-17 NOTE — Telephone Encounter (Signed)
May ref incr to numb 24

## 2014-09-03 ENCOUNTER — Other Ambulatory Visit: Payer: Self-pay | Admitting: Orthopedic Surgery

## 2014-09-07 ENCOUNTER — Encounter (HOSPITAL_BASED_OUTPATIENT_CLINIC_OR_DEPARTMENT_OTHER): Payer: Self-pay | Admitting: *Deleted

## 2014-09-09 ENCOUNTER — Ambulatory Visit (HOSPITAL_BASED_OUTPATIENT_CLINIC_OR_DEPARTMENT_OTHER): Payer: BC Managed Care – PPO | Admitting: Anesthesiology

## 2014-09-09 ENCOUNTER — Ambulatory Visit (HOSPITAL_BASED_OUTPATIENT_CLINIC_OR_DEPARTMENT_OTHER)
Admission: RE | Admit: 2014-09-09 | Discharge: 2014-09-09 | Disposition: A | Discharge status: Home / Self Care | Payer: BC Managed Care – PPO | Source: Ambulatory Visit | Attending: Orthopedic Surgery | Admitting: Orthopedic Surgery

## 2014-09-09 ENCOUNTER — Encounter (HOSPITAL_BASED_OUTPATIENT_CLINIC_OR_DEPARTMENT_OTHER): Payer: Self-pay | Admitting: *Deleted

## 2014-09-09 ENCOUNTER — Encounter (HOSPITAL_BASED_OUTPATIENT_CLINIC_OR_DEPARTMENT_OTHER)
Admission: RE | Disposition: A | Discharge status: Home / Self Care | Payer: Self-pay | Source: Ambulatory Visit | Attending: Orthopedic Surgery

## 2014-09-09 DIAGNOSIS — K219 Gastro-esophageal reflux disease without esophagitis: Secondary | ICD-10-CM | POA: Insufficient documentation

## 2014-09-09 DIAGNOSIS — G43909 Migraine, unspecified, not intractable, without status migrainosus: Secondary | ICD-10-CM | POA: Insufficient documentation

## 2014-09-09 DIAGNOSIS — K589 Irritable bowel syndrome without diarrhea: Secondary | ICD-10-CM | POA: Diagnosis not present

## 2014-09-09 DIAGNOSIS — Z885 Allergy status to narcotic agent status: Secondary | ICD-10-CM | POA: Insufficient documentation

## 2014-09-09 DIAGNOSIS — Z79899 Other long term (current) drug therapy: Secondary | ICD-10-CM | POA: Insufficient documentation

## 2014-09-09 DIAGNOSIS — F419 Anxiety disorder, unspecified: Secondary | ICD-10-CM | POA: Insufficient documentation

## 2014-09-09 DIAGNOSIS — G5602 Carpal tunnel syndrome, left upper limb: Secondary | ICD-10-CM | POA: Insufficient documentation

## 2014-09-09 DIAGNOSIS — G8918 Other acute postprocedural pain: Secondary | ICD-10-CM | POA: Diagnosis not present

## 2014-09-09 DIAGNOSIS — M79642 Pain in left hand: Secondary | ICD-10-CM | POA: Diagnosis not present

## 2014-09-09 DIAGNOSIS — I10 Essential (primary) hypertension: Secondary | ICD-10-CM | POA: Insufficient documentation

## 2014-09-09 HISTORY — DX: Other specified postprocedural states: Z98.890

## 2014-09-09 HISTORY — DX: Nausea with vomiting, unspecified: R11.2

## 2014-09-09 HISTORY — PX: CARPAL TUNNEL RELEASE: SHX101

## 2014-09-09 HISTORY — DX: Bronchitis, not specified as acute or chronic: J40

## 2014-09-09 HISTORY — DX: Anxiety disorder, unspecified: F41.9

## 2014-09-09 HISTORY — DX: Essential (primary) hypertension: I10

## 2014-09-09 LAB — POCT HEMOGLOBIN-HEMACUE: Hemoglobin: 12.8 g/dL (ref 12.0–15.0)

## 2014-09-09 SURGERY — CARPAL TUNNEL RELEASE
Anesthesia: General | Site: Wrist | Laterality: Left

## 2014-09-09 MED ORDER — HYDROCODONE-ACETAMINOPHEN 7.5-325 MG PO TABS: 1.0000 | ORAL_TABLET | Freq: Once | ORAL | Status: DC | PRN

## 2014-09-09 MED ORDER — PROPOFOL 10 MG/ML IV BOLUS
INTRAVENOUS | Status: DC | PRN
  Administered 2014-09-09: 200 mg via INTRAVENOUS

## 2014-09-09 MED ORDER — HYDROMORPHONE HCL 1 MG/ML IJ SOLN
INTRAMUSCULAR | Status: AC
  Filled 2014-09-09: qty 1

## 2014-09-09 MED ORDER — GLYCOPYRROLATE 0.2 MG/ML IJ SOLN: 0.2000 mg | Freq: Once | INTRAMUSCULAR | Status: DC | PRN

## 2014-09-09 MED ORDER — ONDANSETRON HCL 4 MG/2ML IJ SOLN
INTRAMUSCULAR | Status: DC | PRN
  Administered 2014-09-09: 4 mg via INTRAVENOUS

## 2014-09-09 MED ORDER — CHLORHEXIDINE GLUCONATE 4 % EX LIQD: 60.0000 mL | Freq: Once | CUTANEOUS | Status: DC

## 2014-09-09 MED ORDER — DEXAMETHASONE SODIUM PHOSPHATE 4 MG/ML IJ SOLN
INTRAMUSCULAR | Status: DC | PRN
  Administered 2014-09-09: 10 mg via INTRAVENOUS

## 2014-09-09 MED ORDER — SCOPOLAMINE 1 MG/3DAYS TD PT72
MEDICATED_PATCH | TRANSDERMAL | Status: AC
  Filled 2014-09-09: qty 1

## 2014-09-09 MED ORDER — FENTANYL CITRATE (PF) 100 MCG/2ML IJ SOLN
INTRAMUSCULAR | Status: AC
  Filled 2014-09-09: qty 4

## 2014-09-09 MED ORDER — MIDAZOLAM HCL 2 MG/2ML IJ SOLN
1.0000 mg | INTRAMUSCULAR | Status: DC | PRN
  Administered 2014-09-09: 2 mg via INTRAVENOUS

## 2014-09-09 MED ORDER — MIDAZOLAM HCL 2 MG/2ML IJ SOLN
INTRAMUSCULAR | Status: AC
  Filled 2014-09-09: qty 2

## 2014-09-09 MED ORDER — KETOROLAC TROMETHAMINE 30 MG/ML IJ SOLN
INTRAMUSCULAR | Status: DC | PRN
  Administered 2014-09-09: 30 mg via INTRAVENOUS

## 2014-09-09 MED ORDER — LACTATED RINGERS IV SOLN
INTRAVENOUS | Status: DC
  Administered 2014-09-09: 09:00:00 via INTRAVENOUS

## 2014-09-09 MED ORDER — LIDOCAINE HCL (CARDIAC) 20 MG/ML IV SOLN
INTRAVENOUS | Status: DC | PRN
  Administered 2014-09-09: 50 mg via INTRAVENOUS

## 2014-09-09 MED ORDER — SCOPOLAMINE 1 MG/3DAYS TD PT72
1.0000 | MEDICATED_PATCH | Freq: Once | TRANSDERMAL | Status: AC | PRN
  Administered 2014-09-09: 1 via TRANSDERMAL

## 2014-09-09 MED ORDER — BUPIVACAINE HCL (PF) 0.25 % IJ SOLN
INTRAMUSCULAR | Status: AC
  Filled 2014-09-09: qty 90

## 2014-09-09 MED ORDER — OXYCODONE-ACETAMINOPHEN 5-325 MG PO TABS: ORAL_TABLET | ORAL | Status: DC

## 2014-09-09 MED ORDER — FENTANYL CITRATE (PF) 100 MCG/2ML IJ SOLN
50.0000 ug | INTRAMUSCULAR | Status: DC | PRN
Start: 2014-09-09 — End: 2014-09-09
  Administered 2014-09-09: 25 ug via INTRAVENOUS
  Administered 2014-09-09: 100 ug via INTRAVENOUS

## 2014-09-09 MED ORDER — PROMETHAZINE HCL 25 MG/ML IJ SOLN: 6.2500 mg | INTRAMUSCULAR | Status: DC | PRN

## 2014-09-09 MED ORDER — KETOROLAC TROMETHAMINE 30 MG/ML IJ SOLN: 30.0000 mg | Freq: Once | INTRAMUSCULAR | Status: DC | PRN

## 2014-09-09 MED ORDER — CEFAZOLIN SODIUM-DEXTROSE 2-3 GM-% IV SOLR
INTRAVENOUS | Status: AC
  Filled 2014-09-09: qty 50

## 2014-09-09 MED ORDER — BUPIVACAINE HCL (PF) 0.25 % IJ SOLN
INTRAMUSCULAR | Status: DC | PRN
  Administered 2014-09-09: 10 mL

## 2014-09-09 MED ORDER — HYDROMORPHONE HCL 1 MG/ML IJ SOLN
0.2500 mg | INTRAMUSCULAR | Status: DC | PRN
  Administered 2014-09-09 (×2): 0.5 mg via INTRAVENOUS

## 2014-09-09 MED ORDER — CEFAZOLIN SODIUM-DEXTROSE 2-3 GM-% IV SOLR
2.0000 g | INTRAVENOUS | Status: AC
  Administered 2014-09-09: 2 g via INTRAVENOUS

## 2014-09-09 SURGICAL SUPPLY — 41 items
BANDAGE ELASTIC 3 VELCRO ST LF (GAUZE/BANDAGES/DRESSINGS) ×3 IMPLANT
BLADE MINI RND TIP GREEN BEAV (BLADE) IMPLANT
BLADE SURG 15 STRL LF DISP TIS (BLADE) ×2 IMPLANT
BLADE SURG 15 STRL SS (BLADE) ×6
BNDG CMPR 9X4 STRL LF SNTH (GAUZE/BANDAGES/DRESSINGS) ×1
BNDG ESMARK 4X9 LF (GAUZE/BANDAGES/DRESSINGS) ×2 IMPLANT
BNDG GAUZE ELAST 4 BULKY (GAUZE/BANDAGES/DRESSINGS) ×3 IMPLANT
CHLORAPREP W/TINT 26ML (MISCELLANEOUS) ×3 IMPLANT
CORDS BIPOLAR (ELECTRODE) ×3 IMPLANT
COVER BACK TABLE 60X90IN (DRAPES) ×3 IMPLANT
COVER MAYO STAND STRL (DRAPES) ×3 IMPLANT
CUFF TOURNIQUET SINGLE 18IN (TOURNIQUET CUFF) ×3 IMPLANT
DRAPE EXTREMITY T 121X128X90 (DRAPE) ×3 IMPLANT
DRAPE SURG 17X23 STRL (DRAPES) ×3 IMPLANT
DRSG PAD ABDOMINAL 8X10 ST (GAUZE/BANDAGES/DRESSINGS) ×3 IMPLANT
GAUZE SPONGE 4X4 12PLY STRL (GAUZE/BANDAGES/DRESSINGS) ×3 IMPLANT
GAUZE XEROFORM 1X8 LF (GAUZE/BANDAGES/DRESSINGS) ×3 IMPLANT
GLOVE BIO SURGEON STRL SZ7.5 (GLOVE) ×3 IMPLANT
GLOVE BIOGEL PI IND STRL 7.0 (GLOVE) IMPLANT
GLOVE BIOGEL PI IND STRL 8 (GLOVE) ×1 IMPLANT
GLOVE BIOGEL PI INDICATOR 7.0 (GLOVE) ×2
GLOVE BIOGEL PI INDICATOR 8 (GLOVE) ×2
GLOVE ECLIPSE 6.5 STRL STRAW (GLOVE) ×2 IMPLANT
GOWN STRL REUS W/ TWL LRG LVL3 (GOWN DISPOSABLE) ×1 IMPLANT
GOWN STRL REUS W/TWL LRG LVL3 (GOWN DISPOSABLE) ×3
GOWN STRL REUS W/TWL XL LVL3 (GOWN DISPOSABLE) ×3 IMPLANT
NDL HYPO 25X1 1.5 SAFETY (NEEDLE) IMPLANT
NEEDLE HYPO 25X1 1.5 SAFETY (NEEDLE) ×3 IMPLANT
NS IRRIG 1000ML POUR BTL (IV SOLUTION) ×3 IMPLANT
PACK BASIN DAY SURGERY FS (CUSTOM PROCEDURE TRAY) ×3 IMPLANT
PAD CAST 3X4 CTTN HI CHSV (CAST SUPPLIES) IMPLANT
PADDING CAST ABS 4INX4YD NS (CAST SUPPLIES) ×2
PADDING CAST ABS COTTON 4X4 ST (CAST SUPPLIES) ×1 IMPLANT
PADDING CAST COTTON 3X4 STRL (CAST SUPPLIES) ×3
SPONGE GAUZE 4X4 12PLY STER LF (GAUZE/BANDAGES/DRESSINGS) ×2 IMPLANT
STOCKINETTE 4X48 STRL (DRAPES) ×3 IMPLANT
SUT ETHILON 4 0 PS 2 18 (SUTURE) ×3 IMPLANT
SYR BULB 3OZ (MISCELLANEOUS) ×3 IMPLANT
SYR CONTROL 10ML LL (SYRINGE) ×2 IMPLANT
TOWEL OR 17X24 6PK STRL BLUE (TOWEL DISPOSABLE) ×6 IMPLANT
UNDERPAD 30X30 (UNDERPADS AND DIAPERS) ×3 IMPLANT

## 2014-09-09 NOTE — Anesthesia Procedure Notes (Signed)
Procedure Name: LMA Insertion Performed by: York GricePEARSON, Darion Juhasz W Pre-anesthesia Checklist: Patient identified, Timeout performed, Emergency Drugs available, Suction available and Patient being monitored Patient Re-evaluated:Patient Re-evaluated prior to inductionOxygen Delivery Method: Circle system utilized Preoxygenation: Pre-oxygenation with 100% oxygen Intubation Type: IV induction Ventilation: Mask ventilation without difficulty LMA: LMA inserted LMA Size: 5.0 Tube type: Oral Number of attempts: 1 Tube secured with: Tape Dental Injury: Teeth and Oropharynx as per pre-operative assessment

## 2014-09-09 NOTE — H&P (Signed)
Mackenzie Gray is an 49 y.o. female.   Chief Complaint: carpal tunnel syndrome HPI: 49 yo rhd female with pain and numbness in left hand for months.  Has had c-spine surgeries.  Nocturnal symptoms wake her nightly.  Positive nerve conduction studies.  She wishes to have a left carpal tunnel release for management of symptoms.  Past Medical History  Diagnosis Date  . Migraines   . ADD (attention deficit disorder)     Adult  . GERD (gastroesophageal reflux disease)   . IBS (irritable bowel syndrome)   . Hypoestrogenism   . Hypertension   . Bronchitis   . Anxiety   . PONV (postoperative nausea and vomiting)     Past Surgical History  Procedure Laterality Date  . Abdominal hysterectomy    . Disc removal 5-6 vert    . Esophagogastroduodenoscopy    . Colonoscopy    . Total abdominal hysterectomy w/ bilateral salpingoophorectomy Left   . Tubal ligation      Family History  Problem Relation Age of Onset  . Heart disease    . Arthritis    . Lung disease    . Cancer    . Asthma    . Diabetes    . Kidney disease    . Diabetes Mother     Borderline  . Hyperlipidemia Mother   . Stroke Mother   . Heart disease Mother   . COPD Mother   . Stroke Father   . Hyperlipidemia Father   . Heart disease Father   . COPD Father    Social History:  reports that she has never smoked. She has never used smokeless tobacco. She reports that she does not drink alcohol or use illicit drugs.  Allergies:  Allergies  Allergen Reactions  . Codeine Itching  . Morphine Itching  . Other Itching    cats  . Tramadol Swelling    Throat swelling    Medications Prior to Admission  Medication Sig Dispense Refill  . albuterol (PROVENTIL HFA;VENTOLIN HFA) 108 (90 BASE) MCG/ACT inhaler Inhale 2 puffs into the lungs every 6 (six) hours as needed for wheezing. 1 Inhaler 2  . ALPRAZolam (XANAX) 0.5 MG tablet TAKE 1 TABLET BY MOUTH ONCE DAILY AS NEEDED FOR ANXIETY. 24 tablet 0  . diazepam (VALIUM) 5 MG  tablet Take 2.5-5 mg by mouth as needed for muscle spasms.     Marland Kitchen. estradiol (ESTRACE) 2 MG tablet Take 1 tablet (2 mg total) by mouth daily. 30 tablet 11  . ibuprofen (ADVIL,MOTRIN) 600 MG tablet Take 1 tablet (600 mg total) by mouth every 8 (eight) hours as needed. 15 tablet 0  . Melatonin 1 MG CAPS Take 1-18 mg by mouth at bedtime as needed (sleep).     . triamterene-hydrochlorothiazide (DYAZIDE) 37.5-25 MG per capsule Take 1 each (1 capsule total) by mouth daily. 30 capsule 5    Results for orders placed or performed during the hospital encounter of 09/09/14 (from the past 48 hour(s))  Hemoglobin-hemacue, POC     Status: None   Collection Time: 09/09/14  9:20 AM  Result Value Ref Range   Hemoglobin 12.8 12.0 - 15.0 g/dL    No results found.   A comprehensive review of systems was negative except for: Musculoskeletal: positive for back pain  Blood pressure 136/90, pulse 75, temperature 97.7 F (36.5 C), temperature source Oral, resp. rate 20, height 5\' 4"  (1.626 m), weight 95.709 kg (211 lb), SpO2 99 %.  General appearance: alert, cooperative  and appears stated age Head: Normocephalic, without obvious abnormality, atraumatic Neck: supple, symmetrical, trachea midline Resp: clear to auscultation bilaterally Cardio: regular rate and rhythm GI: non tender Extremities: intact sensation and capillary refill all digits.  +epl/fpl/io.  no wounds. Pulses: 2+ and symmetric Skin: Skin color, texture, turgor normal. No rashes or lesions Neurologic: Grossly normal Incision/Wound: none  Assessment/Plan Left carpal tunnel syndrome.  Non operative and operative treatment options were discussed with the patient and patient wishes to proceed with operative treatment. Risks, benefits, and alternatives of surgery were discussed and the patient agrees with the plan of care.  Risk of damage to motor branch discussed as well.   Christopherjohn Schiele R 09/09/2014, 9:25 AM

## 2014-09-09 NOTE — Anesthesia Postprocedure Evaluation (Signed)
  Anesthesia Post-op Note  Patient: Mackenzie Gray  Procedure(s) Performed: Procedure(s): LEFT CARPAL TUNNEL RELEASE (Left)  Patient Location: PACU  Anesthesia Type:General  Level of Consciousness: awake and alert   Airway and Oxygen Therapy: Patient Spontanous Breathing  Post-op Pain: mild  Post-op Assessment: Post-op Vital signs reviewed              Post-op Vital Signs: Reviewed  Last Vitals:  Filed Vitals:   09/09/14 1115  BP: 140/88  Pulse: 90  Temp:   Resp:     Complications: No apparent anesthesia complications

## 2014-09-09 NOTE — Op Note (Signed)
[id number] 

## 2014-09-09 NOTE — Brief Op Note (Signed)
09/09/2014  10:05 AM  PATIENT:  Carrie Mewobin Coopersmith  49 y.o. female  PRE-OPERATIVE DIAGNOSIS:  left carpal tunnel syndrome   POST-OPERATIVE DIAGNOSIS:  left carpal tunnel syndrome  PROCEDURE:  Procedure(s): LEFT CARPAL TUNNEL RELEASE (Left)  SURGEON:  Surgeon(s) and Role:    * Betha LoaKevin Quincy Prisco, MD - Primary  PHYSICIAN ASSISTANT:   ASSISTANTS: none   ANESTHESIA:   general  EBL:  Total I/O In: 700 [I.V.:700] Out: -   BLOOD ADMINISTERED:none  DRAINS: none   LOCAL MEDICATIONS USED:  MARCAINE     SPECIMEN:  No Specimen  DISPOSITION OF SPECIMEN:  N/A  COUNTS:  YES  TOURNIQUET:   Total Tourniquet Time Documented: Upper Arm (Left) - 17 minutes Total: Upper Arm (Left) - 17 minutes   DICTATION: .Other Dictation: Dictation Number [id number]  PLAN OF CARE: Discharge to home after PACU  PATIENT DISPOSITION:  PACU - hemodynamically stable.

## 2014-09-09 NOTE — Anesthesia Preprocedure Evaluation (Addendum)
Anesthesia Evaluation  Patient identified by MRN, date of birth, ID band Patient awake    Reviewed: Allergy & Precautions, NPO status , Patient's Chart, lab work & pertinent test results  History of Anesthesia Complications (+) PONV  Airway Mallampati: II  TM Distance: >3 FB Neck ROM: Full    Dental   Pulmonary neg pulmonary ROS,  breath sounds clear to auscultation        Cardiovascular hypertension, Pt. on medications Rhythm:Regular Rate:Normal     Neuro/Psych  Headaches, Anxiety  Neuromuscular disease    GI/Hepatic Neg liver ROS, GERD-  ,  Endo/Other  negative endocrine ROS  Renal/GU negative Renal ROS     Musculoskeletal   Abdominal   Peds  Hematology negative hematology ROS (+)   Anesthesia Other Findings   Reproductive/Obstetrics                            Anesthesia Physical Anesthesia Plan  ASA: II  Anesthesia Plan: General   Post-op Pain Management:    Induction: Intravenous  Airway Management Planned: LMA  Additional Equipment:   Intra-op Plan:   Post-operative Plan:   Informed Consent: I have reviewed the patients History and Physical, chart, labs and discussed the procedure including the risks, benefits and alternatives for the proposed anesthesia with the patient or authorized representative who has indicated his/her understanding and acceptance.     Plan Discussed with: CRNA  Anesthesia Plan Comments:        Anesthesia Quick Evaluation

## 2014-09-09 NOTE — Transfer of Care (Signed)
Immediate Anesthesia Transfer of Care Note  Patient: Mackenzie Gray  Procedure(s) Performed: Procedure(s): LEFT CARPAL TUNNEL RELEASE (Left)  Patient Location: PACU  Anesthesia Type:General  Level of Consciousness: awake and sedated  Airway & Oxygen Therapy: Patient Spontanous Breathing and Patient connected to face mask oxygen  Post-op Assessment: Report given to RN and Post -op Vital signs reviewed and stable  Post vital signs: Reviewed and stable  Last Vitals:  Filed Vitals:   09/09/14 0906  BP: 136/90  Pulse: 75  Temp: 36.5 C  Resp: 20    Complications: No apparent anesthesia complications

## 2014-09-09 NOTE — Discharge Instructions (Addendum)

## 2014-09-10 ENCOUNTER — Encounter (HOSPITAL_BASED_OUTPATIENT_CLINIC_OR_DEPARTMENT_OTHER): Payer: Self-pay | Admitting: Orthopedic Surgery

## 2014-09-10 NOTE — Op Note (Signed)
NAMCarrie Gray:  Gray, Mackenzie                 ACCOUNT NO.:  [id number]  MEDICAL RECORD NO.:  [id number]  LOCATION:                               FACILITY:  [facility]  PHYSICIAN:  Betha LoaKevin Vonetta Foulk, MD        DATE OF BIRTH:  03-13-1965  DATE OF PROCEDURE:  09/09/2014 DATE OF DISCHARGE:  09/09/2014                              OPERATIVE REPORT   PREOPERATIVE DIAGNOSIS:  Left carpal tunnel syndrome.  POSTOPERATIVE DIAGNOSIS:  Left carpal tunnel syndrome.  PROCEDURE:  Left carpal tunnel release.  SURGEON:  Betha LoaKevin Hanzel Pizzo, MD  ASSISTANT:  None.  ANESTHESIA:  General.  IV FLUIDS:  Per anesthesia flow sheet.  ESTIMATED BLOOD LOSS:  Minimal.  COMPLICATIONS:  None.  SPECIMENS:  None.  TOURNIQUET TIME:  17 minutes.  DISPOSITION:  Stable to PACU.  INDICATIONS:  [name] is a 49 year old right-hand-dominant female who has had numbness and pain in the left hand for many months.  This is bothersome to her.  It wakes her at night.  She has positive nerve conduction studies.  She wishes to have a carpal tunnel release for helping and managing her symptoms.  Risks, benefits and alternatives of surgery were discussed including the risk of blood loss; infection; damage to nerves, vessels, tendons, ligaments, bone; failure of surgery; need for additional surgery; complications with wound healing; continued pain; recurrence of carpal tunnel syndrome; and damage to motor branch. She voiced understanding of these risks and elected to proceed.  OPERATIVE COURSE:  After being identified preoperatively by myself, the patient and I agreed upon the procedure and site of procedure.  The surgical site was marked.  The risks, benefits, and alternatives of the surgery were reviewed and she wished to proceed.  Surgical consent had been signed.  She was given IV Ancef as preoperative antibiotic prophylaxis.  She was transferred to the operating room and placed on the operating room table in supine position with left  upper extremity on an armboard.  General anesthesia was induced by Anesthesiology.  Left upper extremity was prepped and draped in normal sterile orthopedic fashion.  A surgical pause was performed between the surgeons, anesthesia, and operating room staff, and all were in agreement as to the patient, procedure, and site of procedure.  Tourniquet at the proximal aspect of the extremity was inflated to 250 mmHg after exsanguination of the limb with an Esmarch bandage.  Incision was made in the palm over the transverse carpal ligament and carried into the subcutaneous tissues by spreading technique.  Bipolar electrocautery was used to obtain hemostasis.  The palmar fascia was sharply incised. Transverse carpal ligament was identified and sharply incised.  It was incised distally first.  Care was taken to ensure complete decompression distally.  It was then incised proximally.  Scissors were used to split the distal aspect of the volar antebrachial fascia.  Finger was placed into the wound to ensure complete decompression, which was the case. The nerve was examined.  It was strongly adherent to the radial leaflet. This was freed up.  The motor branch was identified and was intact.  The wound was copiously irrigated with sterile saline.  It was then  closed with 4-0 nylon in a horizontal mattress fashion.  It was injected with 10 mL of 0.25% plain Marcaine to aid in postoperative analgesia.  It was then dressed with sterile Xeroform, 4x4s, and ABD and wrapped with Kerlix and Ace bandage.  Tourniquet was deflated at 17 minutes. Fingertips were pink with brisk capillary refill after deflation of the tourniquet.  Operative drapes were broken down.  The patient was awakened from anesthesia safely.  She was transferred back to stretcher and taken to the PACU in stable condition.  I will see her back in the office in 1 week for postoperative followup.  I will give her Percocet 5/325, 1-2 p.o. q.6  hours p.r.n. pain, dispensed #30.     Betha Loa, MD   ______________________________ Betha Loa, MD    KK/MEDQ  D:  09/09/2014  T:  09/10/2014  Job:  [id number]

## 2014-09-20 ENCOUNTER — Ambulatory Visit (INDEPENDENT_AMBULATORY_CARE_PROVIDER_SITE_OTHER): Payer: BC Managed Care – PPO | Admitting: Nurse Practitioner

## 2014-09-20 ENCOUNTER — Encounter: Payer: Self-pay | Admitting: Nurse Practitioner

## 2014-09-20 VITALS — BP 126/84 | Ht 64.0 in | Wt 216.0 lb

## 2014-09-20 DIAGNOSIS — I878 Other specified disorders of veins: Secondary | ICD-10-CM | POA: Diagnosis not present

## 2014-09-20 DIAGNOSIS — K219 Gastro-esophageal reflux disease without esophagitis: Secondary | ICD-10-CM | POA: Diagnosis not present

## 2014-09-21 ENCOUNTER — Encounter: Payer: Self-pay | Admitting: Nurse Practitioner

## 2014-09-21 NOTE — Progress Notes (Signed)
Subjective:  Presents to discuss bariatric surgery. Has tried measuring portions and exercise. Has joined the [employer] in the past. Cannot afford [employer] or other programs. Has tried apps on her phone. Also tried Slim fast and liposene. Has had a fusion in her back. Also history of venous stasis. Has had reflux twice in the past month. Family history includes heart disease hypercholesterolemia diabetes hypertension and stroke.  Objective:   BP 126/84 mmHg  Ht 5\' 4"  (1.626 m)  Wt 216 lb (97.977 kg)  BMI 37.06 kg/m2 NAD. Alert, oriented. Lungs clear. Heart regular rate rhythm. Waist circumference 44 inches. Abdomen soft nondistended nontender. Multiple small varicosities noted in both lower extremities.  Assessment:  Problem List Items Addressed This Visit      Digestive   GERD     Other   Venous stasis - Primary    Other Visit Diagnoses    Morbid obesity          Plan: Patient given information to check on bariatric surgery. Our goal for this month is for her to decrease her sugar intake including regular soda, sweet tea and fruit juices. Return in about 1 month (around 10/21/2014) for recheck.

## 2014-10-01 ENCOUNTER — Encounter: Payer: Self-pay | Admitting: Nurse Practitioner

## 2014-10-06 ENCOUNTER — Ambulatory Visit (INDEPENDENT_AMBULATORY_CARE_PROVIDER_SITE_OTHER): Payer: BC Managed Care – PPO | Admitting: Family Medicine

## 2014-10-06 VITALS — BP 118/76 | Temp 98.8°F | Ht 64.0 in | Wt 216.0 lb

## 2014-10-06 DIAGNOSIS — H6092 Unspecified otitis externa, left ear: Secondary | ICD-10-CM

## 2014-10-06 MED ORDER — NEOMYCIN-POLYMYXIN-HC 3.5-10000-1 OT SOLN: OTIC | Status: DC

## 2014-10-06 MED ORDER — AMOXICILLIN-POT CLAVULANATE 875-125 MG PO TABS: 1.0000 | ORAL_TABLET | Freq: Two times a day (BID) | ORAL | Status: DC

## 2014-10-06 NOTE — Progress Notes (Signed)
   Subjective:    Patient ID: Mackenzie Gray, female    DOB: [birth year], 49 y.o.   MRN: [id number]  Otalgia  There is pain in both ears. This is a new problem. Episode onset: 2 days. Associated symptoms comments: Congestion, cough. Treatments tried: mineral oil, heat. The treatment provided no relief.    Not swiimming   Left ear more painful  Still having significant pain in hads and elbows  Mild headache  Review of Systems  HENT: Positive for ear pain.    no vomiting no diarrhea     Objective:   Physical Exam  Alert vitals stable external ears inflammation left greater than right. Slight dull changes left tympanic membrane pharynx normal neck supple lungs clear heart regular in rhythm      Assessment & Plan:  Impression 1 external otitis plan antibiotics prescribed. Antibiotics external drops and suspension proper use discussed. Multiple questions answered WSL 15 minutes mostly in discussion

## 2014-10-25 ENCOUNTER — Ambulatory Visit (INDEPENDENT_AMBULATORY_CARE_PROVIDER_SITE_OTHER): Payer: BC Managed Care – PPO | Admitting: Nurse Practitioner

## 2014-10-25 ENCOUNTER — Encounter: Payer: Self-pay | Admitting: Nurse Practitioner

## 2014-10-25 VITALS — BP 122/88 | Ht 64.0 in | Wt 219.0 lb

## 2014-10-25 DIAGNOSIS — R5383 Other fatigue: Secondary | ICD-10-CM

## 2014-10-25 DIAGNOSIS — Z111 Encounter for screening for respiratory tuberculosis: Secondary | ICD-10-CM | POA: Diagnosis not present

## 2014-10-26 ENCOUNTER — Encounter: Payer: Self-pay | Admitting: Nurse Practitioner

## 2014-10-26 NOTE — Progress Notes (Signed)
Subjective:  Presents for recheck. Continues to experience fatigue especially with her weight gain. Admits to some binge eating. Defers use of Vyvanse which is a new indication. States she would like to avoid taking pills. Is interested in bariatric surgery but has not contacted her insurance company or look at her provider list. Plans to restart her exercise program at the [facility]. Does not drink any sodas at this point. Is requesting a dietary consult.  Objective:   BP 122/88 mmHg  Ht  (1.626 m)  Wt 219 lb (99.338 kg)  BMI 37.57 kg/m2 NAD. Alert, oriented. Lungs clear. Heart regular rate rhythm.  Assessment:  Problem List Items Addressed This Visit      Other   Morbid obesity    Other Visit Diagnoses    Other fatigue    -  Primary    Screening for tuberculosis        Relevant Orders    TB Skin Test (Completed)      Plan: Referral for dietary consult. Patient to contact her insurance company to see if bariatric surgery is covered and get a list of providers. Patient understands this is her responsibility and that she needs to start this process. To complicate matters, patient's hours may be cut at work and she could potentially lose her insurance. If she wishes to continue, recheck in one month for weight check.

## 2014-10-27 LAB — TB SKIN TEST
Induration: 0 mm
TB SKIN TEST: NEGATIVE

## 2014-11-04 ENCOUNTER — Other Ambulatory Visit: Payer: Self-pay | Admitting: *Deleted

## 2014-12-09 ENCOUNTER — Encounter: Payer: Self-pay | Admitting: Family Medicine

## 2014-12-09 ENCOUNTER — Ambulatory Visit (INDEPENDENT_AMBULATORY_CARE_PROVIDER_SITE_OTHER): Payer: BC Managed Care – PPO | Admitting: Family Medicine

## 2014-12-09 VITALS — BP 128/82 | Ht 64.0 in | Wt 220.0 lb

## 2014-12-09 DIAGNOSIS — K921 Melena: Secondary | ICD-10-CM | POA: Diagnosis not present

## 2014-12-09 DIAGNOSIS — R5383 Other fatigue: Secondary | ICD-10-CM | POA: Diagnosis not present

## 2014-12-09 DIAGNOSIS — K529 Noninfective gastroenteritis and colitis, unspecified: Secondary | ICD-10-CM

## 2014-12-09 LAB — POCT HEMOGLOBIN: Hemoglobin: 15.9 g/dL (ref 12.2–16.2)

## 2014-12-09 MED ORDER — METRONIDAZOLE 500 MG PO TABS: 500.0000 mg | ORAL_TABLET | Freq: Three times a day (TID) | ORAL | Status: DC

## 2014-12-09 MED ORDER — DICYCLOMINE HCL 10 MG PO CAPS: 10.0000 mg | ORAL_CAPSULE | Freq: Three times a day (TID) | ORAL | Status: DC | PRN

## 2014-12-09 MED ORDER — CIPROFLOXACIN HCL 500 MG PO TABS: 500.0000 mg | ORAL_TABLET | Freq: Two times a day (BID) | ORAL | Status: DC

## 2014-12-09 NOTE — Progress Notes (Signed)
   Subjective:    Patient ID: Mackenzie Gray, female    DOB: [birth year], 49 y.o.   MRN: [id number]  HPI  Patient arrives with c/o abdominal pain and dark stools. Patient has tried pepto and immodium  no prior history of this problem  she states she has seen gastroenterology before but she believes it was years ago   patient with abdominal pain discomfort over the past 56 days worse on the left side no fever or chills with it had several loose stools today she try to take Pepto-Bismol could not swallow it then she stated she had dark stool with some blood. She does not think she took in enough Pepto-Bismol to cause it to change her stool. Review of Systems  patient relates abdominal pain relates loose stools relates blood in stool denies fever chills but does relate feeling bad and intermittent vomiting no cough wheezing or difficulty breathing no dysuria no joint pain    Objective:   Physical Exam  lungs are clear respiratory rate normal heart regular abdomen is soft with mild left side abdominal discomfort rectal exam darkened stool but trace heme positive. Extremities no edema no rash noted       Assessment & Plan:   abdominal pain possible colitis recommend referral to gastroenterology also go ahead with anti-spasmodic Cipro and Flagyl if high fevers chills severe abdominal pain or worse go to ER. Call us if any problems.   we will have patient do lab work. We will also work on referral.

## 2014-12-10 NOTE — Progress Notes (Signed)
Called patient per Dr.Scott Luking - and informed her to get the following labs drawn today if possible: CBC, Liver, Lipase, BMET. Patient verbalized understanding.

## 2014-12-10 NOTE — Addendum Note (Signed)
Addended by: Theodora Blow R on: 12/10/2014 10:06 AM   Modules accepted: Orders

## 2014-12-11 LAB — CBC WITH DIFFERENTIAL/PLATELET
Basophils Absolute: 0.1 10*3/uL (ref 0.0–0.2)
Basos: 1 %
EOS (ABSOLUTE): 0.2 10*3/uL (ref 0.0–0.4)
EOS: 3 %
HEMOGLOBIN: 13.3 g/dL (ref 11.1–15.9)
Hematocrit: 40.1 % (ref 34.0–46.6)
IMMATURE GRANS (ABS): 0 10*3/uL (ref 0.0–0.1)
Immature Granulocytes: 0 %
LYMPHS ABS: 2.2 10*3/uL (ref 0.7–3.1)
Lymphs: 29 %
MCH: 29.9 pg (ref 26.6–33.0)
MCHC: 33.2 g/dL (ref 31.5–35.7)
MCV: 90 fL (ref 79–97)
MONOCYTES: 9 %
Monocytes Absolute: 0.6 10*3/uL (ref 0.1–0.9)
Neutrophils Absolute: 4.4 10*3/uL (ref 1.4–7.0)
Neutrophils: 58 %
Platelets: 295 10*3/uL (ref 150–379)
RBC: 4.45 x10E6/uL (ref 3.77–5.28)
RDW: 13.9 % (ref 12.3–15.4)
WBC: 7.5 10*3/uL (ref 3.4–10.8)

## 2014-12-11 LAB — HEPATIC FUNCTION PANEL
ALBUMIN: 4.3 g/dL (ref 3.5–5.5)
ALK PHOS: 67 IU/L (ref 39–117)
ALT: 30 IU/L (ref 0–32)
AST: 21 IU/L (ref 0–40)
BILIRUBIN, DIRECT: 0.1 mg/dL (ref 0.00–0.40)
Bilirubin Total: 0.2 mg/dL (ref 0.0–1.2)
Total Protein: 7.1 g/dL (ref 6.0–8.5)

## 2014-12-11 LAB — BASIC METABOLIC PANEL WITH GFR
BUN/Creatinine Ratio: 28 — ABNORMAL HIGH (ref 9–23)
BUN: 17 mg/dL (ref 6–24)
CO2: 24 mmol/L (ref 18–29)
Calcium: 9.4 mg/dL (ref 8.7–10.2)
Chloride: 102 mmol/L (ref 97–108)
Creatinine, Ser: 0.61 mg/dL (ref 0.57–1.00)
GFR calc Af Amer: 124 mL/min/1.73 (ref >59)
GFR calc non Af Amer: 108 mL/min/1.73 (ref >59)
Glucose: 81 mg/dL (ref 65–99)
Potassium: 4.8 mmol/L (ref 3.5–5.2)
Sodium: 140 mmol/L (ref 134–144)

## 2014-12-11 LAB — LIPASE: Lipase: 47 U/L (ref 0–59)

## 2014-12-13 ENCOUNTER — Encounter: Payer: Self-pay | Admitting: Family Medicine

## 2014-12-13 ENCOUNTER — Encounter: Payer: Self-pay | Admitting: Internal Medicine

## 2014-12-24 ENCOUNTER — Encounter: Payer: BC Managed Care – PPO | Attending: Nurse Practitioner | Admitting: Nutrition

## 2014-12-24 ENCOUNTER — Encounter: Payer: Self-pay | Admitting: Nutrition

## 2014-12-24 DIAGNOSIS — Z713 Dietary counseling and surveillance: Secondary | ICD-10-CM | POA: Insufficient documentation

## 2014-12-24 DIAGNOSIS — Z6836 Body mass index (BMI) 36.0-36.9, adult: Secondary | ICD-10-CM | POA: Insufficient documentation

## 2014-12-24 NOTE — Patient Instructions (Signed)
Goals 1.  Follow the Plate Method 2. Cut out snacks between meals. 3. Increase fresh fruits and vegetables. 4. Drink 1 glass of water before each meals. 5. Exercise 30 minutes a day. 6. Measure foods out for portion control. 7. Lose 1 lb per week.

## 2014-12-24 NOTE — Progress Notes (Signed)
  Medical Nutrition Therapy:  Appt start time: 0800 end time:  0900.  Assessment:  Primary concerns today: Overweight. Lives with her nephew and son. Has a boyfriend. She works and does the shopping and cooking. She admits to eating a lot of processed food due to time limiting. Most foods are baked and grilled and sauted. Eats three meals per day. Lost 7 lbs in the last month from weight loss drug and changes in diet.Marland Kitchen. Physical activity: Stationary bike.. Wants to improve her eating habits. Has been watching poritons and eating more on schedule. Has given up on soda, cut back on pasta and cut back on fried foods and sweets. Takes "Max slim", a weight loss pilll that a costs $100 a month,  twice a day.  Diet is high in sodium, fat and low in fresh fruits and vegetables. She is working on making lifestyle changes. Needs to increase physical activity and follow a low fat high fiber high protein diet.  Lab Results  Component Value Date   HGBA1C 5.4 09/29/2013   Wt Readings from Last 3 Encounters:  12/24/14 213 lb 3.2 oz (96.707 kg)  12/09/14 220 lb (99.791 kg)  10/25/14 219 lb (99.338 kg)   Ht Readings from Last 3 Encounters:  12/24/14 5\' 4"  (1.626 m)  12/09/14 5\' 4"  (1.626 m)  10/25/14 5\' 4"  (1.626 m)   Body mass index is 36.58 kg/(m^2).  Preferred Learning Style:   No preference indicated   Learning Readiness:   Ready  Change in progress   MEDICATIONS:    DIETARY INTAKE:  24-hr recall:  B ( AM): Yogurt, 1% milk, and poptart OR cheese with boiled egg and bacon, water Snk ( AM): grazing L ( PM): Corn dog, califlorina broccoli and water Snk ( PM):  D ( PM): 1 slice pizza with pepperoni, 2 chicken wings, 1 cup of yogurt, water Snk ( PM):  none Beverages: water  Usual physical activity: not much right now. Willing to get on stationary bike and walk.  Estimated energy needs: 1500 calories 170 g carbohydrates 112 g protein 42 g fat  Progress Towards Goal(s):  In  progress.   Nutritional Diagnosis:  NB-1.1 Food and nutrition-related knowledge deficit As related to Obesity.  As evidenced by BMI>30.    Intervention:  Nutrition counseling for weight loss, meal planning, portion sizes, timing of meals, avoiding snacks, My Plate, avoiding processed foods and exercising 30 minutes per day..  Goals 1.  Follow the Plate Method 2. Cut out snacks between meals. 3. Increase fresh fruits and vegetables. 4. Drink 1 glass of water before each meals. 5. Exercise 30 minutes a day. 6. Measure foods out for portion control. 7. Lose 1 lb per week.  Teaching Method Utilized:  Visual Auditory Hands on  Handouts given during visit include:  MY Plate  Meal Plan Card  Strategies for weight loss  Barriers to learning/adherence to lifestyle change: None   Demonstrated degree of understanding via:  Teach Back   Monitoring/Evaluation:  Dietary intake, exercise, meal planning, SBG, and body weight in 1 month(s).

## 2014-12-30 ENCOUNTER — Ambulatory Visit: Payer: BC Managed Care – PPO | Admitting: Nurse Practitioner

## 2015-01-03 ENCOUNTER — Ambulatory Visit: Payer: BC Managed Care – PPO | Admitting: Gastroenterology

## 2015-01-05 ENCOUNTER — Encounter: Payer: Self-pay | Admitting: Family Medicine

## 2015-01-05 ENCOUNTER — Ambulatory Visit (INDEPENDENT_AMBULATORY_CARE_PROVIDER_SITE_OTHER): Payer: BC Managed Care – PPO | Admitting: Family Medicine

## 2015-01-05 ENCOUNTER — Telehealth: Payer: Self-pay | Admitting: Family Medicine

## 2015-01-05 VITALS — BP 124/76 | Temp 98.5°F | Ht 64.0 in | Wt 214.1 lb

## 2015-01-05 DIAGNOSIS — H6093 Unspecified otitis externa, bilateral: Secondary | ICD-10-CM

## 2015-01-05 MED ORDER — NEOMYCIN-POLYMYXIN-HC 3.5-10000-1 OT SOLN: 4.0000 [drp] | Freq: Four times a day (QID) | OTIC | Status: DC

## 2015-01-05 NOTE — Progress Notes (Signed)
   Subjective:    Patient ID: Mackenzie Gray, female    DOB: 03-Nov-1965, 49 y.o.   MRN: [id number]  Otalgia  There is pain in both ears. This is a new problem. The current episode started more than 1 month ago. The problem occurs constantly. The problem has been unchanged. There has been no fever. The pain is moderate. She has tried ear drops for the symptoms. The treatment provided no relief.   No concerns at this time.   Pain and tend and aching in the ear  Tight on one side vs the other    No swimming no ear plugs   Review of Systems  HENT: Positive for ear pain.        Objective:   Physical Exam   Alert no acute distress HEENT external canal inflamed right greater than left swollen preauricular tender notes price normal neck supple lungs clear heart regular rate and rhythm      Assessment & Plan:  Impression intermittent external otitis multiple questions answered preventive and interventional discussion plan antibiotic drops symptoms care discussed WSL

## 2015-01-05 NOTE — Telephone Encounter (Signed)
Spoke and patient and informed her that due to the fact that she was last seen in August for ear infection she would need to be reseen for ear infection. Patient verbalized understanding and was transferred to front desk to schedule appointment.

## 2015-01-05 NOTE — Telephone Encounter (Signed)
Pt is requesting a refill on the ear drops she was prescribed pt states her infection is back.   [facility]

## 2015-01-11 ENCOUNTER — Other Ambulatory Visit: Payer: Self-pay

## 2015-01-11 ENCOUNTER — Ambulatory Visit (INDEPENDENT_AMBULATORY_CARE_PROVIDER_SITE_OTHER): Payer: BC Managed Care – PPO | Admitting: Gastroenterology

## 2015-01-11 ENCOUNTER — Encounter: Payer: Self-pay | Admitting: Gastroenterology

## 2015-01-11 VITALS — BP 128/85 | HR 82 | Temp 97.3°F | Ht 64.0 in | Wt 214.2 lb

## 2015-01-11 DIAGNOSIS — R110 Nausea: Secondary | ICD-10-CM | POA: Insufficient documentation

## 2015-01-11 DIAGNOSIS — K921 Melena: Secondary | ICD-10-CM

## 2015-01-11 DIAGNOSIS — K581 Irritable bowel syndrome with constipation: Secondary | ICD-10-CM

## 2015-01-11 DIAGNOSIS — R1013 Epigastric pain: Secondary | ICD-10-CM

## 2015-01-11 DIAGNOSIS — R112 Nausea with vomiting, unspecified: Secondary | ICD-10-CM

## 2015-01-11 DIAGNOSIS — R140 Abdominal distension (gaseous): Secondary | ICD-10-CM | POA: Diagnosis not present

## 2015-01-11 DIAGNOSIS — Z8711 Personal history of peptic ulcer disease: Secondary | ICD-10-CM | POA: Diagnosis not present

## 2015-01-11 DIAGNOSIS — K625 Hemorrhage of anus and rectum: Secondary | ICD-10-CM

## 2015-01-11 DIAGNOSIS — R6881 Early satiety: Secondary | ICD-10-CM

## 2015-01-11 MED ORDER — NA SULFATE-K SULFATE-MG SULF 17.5-3.13-1.6 GM/177ML PO SOLN: 1.0000 | ORAL | Status: DC

## 2015-01-11 NOTE — Progress Notes (Signed)
Primary Care Physician:  Lubertha SouthSteve Luking, MD  Primary Gastroenterologist:  Roetta SessionsMichael Rourk, MD   Chief Complaint  Patient presents with  . Blood In Stools    HPI:  Mackenzie Gray is a 49 y.o. female here at the request of Dr. Gerda DissLuking for further evaluation of blood in the stool as well as other GI complaints.   Patient seen previously by our practice but has been since 2011. She has h/o GERD/IBS, PUD. See PSH for prior EGD/TCS.  No heartburn. No dysphagia. PP nausea. A lot of mid-abdominal bloating. Worse with meals. Early satiety. Some vomiting. Worse last couple of months. BM may skip few days and then some days several BMs. Stool softeners don't help. Soft small stools when go. Very thin. Right side of abdomen more distended. 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 She also had recent two week history of melena and brbpr with each BM. No Pepto-bismol. Makes vomiting. Denies ASA/NSAIDs. Heme positive on DRE by Dr. Gerda DissLuking.    Current Outpatient Prescriptions  Medication Sig Dispense Refill  . albuterol (PROVENTIL HFA;VENTOLIN HFA) 108 (90 BASE) MCG/ACT inhaler Inhale 2 puffs into the lungs every 6 (six) hours as needed for wheezing. 1 Inhaler 2  . ALPRAZolam (XANAX) 0.5 MG tablet TAKE 1 TABLET BY MOUTH ONCE DAILY AS NEEDED FOR ANXIETY. 24 tablet 0  . diazepam (VALIUM) 5 MG tablet Take 2.5-5 mg by mouth as needed for muscle spasms.     Marland Kitchen. estradiol (ESTRACE) 2 MG tablet Take 1 tablet (2 mg total) by mouth daily. 30 tablet 11  . Melatonin 1 MG CAPS Take 1-18 mg by mouth at bedtime as needed (sleep).     . neomycin-polymyxin-hydrocortisone (CORTISPORIN) otic solution Place 4 drops into the right ear 4 (four) times daily. 10 mL 2  . triamterene-hydrochlorothiazide (DYAZIDE) 37.5-25 MG per capsule Take 1 each (1 capsule total) by mouth daily. 30 capsule 5   No current facility-administered medications for this visit.    Allergies as of 01/11/2015 - Review Complete 01/11/2015  Allergen  Reaction Noted  . Phentermine Swelling 10/25/2014  . Codeine Itching   . Morphine Itching   . Other Itching 03/22/2014  . Tramadol Swelling 04/30/2014    Past Medical History  Diagnosis Date  . Migraines   . ADD (attention deficit disorder)     Adult  . GERD (gastroesophageal reflux disease)   . IBS (irritable bowel syndrome)   . Hypoestrogenism   . Hypertension   . Bronchitis   . Anxiety   . PONV (postoperative nausea and vomiting)   . Fatty liver     CT 2013    Past Surgical History  Procedure Laterality Date  . Abdominal hysterectomy    . Disc removal 5-6 vert    . Esophagogastroduodenoscopy    . Colonoscopy    . Total abdominal hysterectomy w/ bilateral salpingoophorectomy Left   . Tubal ligation    . Carpal tunnel release Left 09/09/2014    Procedure: LEFT CARPAL TUNNEL RELEASE;  Surgeon: Betha LoaKevin Kuzma, MD;  Location: [facility];  Service: Orthopedics;  Laterality: Left;  . Colonoscopy  10/2007    RMR: normal TI, normal colon, internal hemorrhoids  . Colonoscopy with esophagogastroduodenoscopy (egd)  2004    RMR: prepyloric ulcer, negative bx, internal hemorrhoids    Family History  Problem Relation Age of Onset  . Heart disease    . Arthritis    . Lung disease    . Cancer    .  Asthma    . Diabetes    . Kidney disease    . Diabetes Mother     Borderline  . Hyperlipidemia Mother   . Stroke Mother   . Heart disease Mother   . COPD Mother   . Cancer Mother [id number]    kidney  . Stroke Father   . Hyperlipidemia Father   . Heart disease Father   . COPD Father   . Hypertension Father   . CAD Mother   . Crohn's disease Sister   . Colon cancer Maternal Grandfather   . Lung cancer Other     grandmother  . Crohn's disease Other     nephew  . Colitis Daughter     Social History   Social History  . Marital Status: Divorced    Spouse Name: N/A  . Number of Children: N/A  . Years of Education: college   Occupational History  . Engineer, [employer]  assistant, bus driver    Social History Main Topics  . Smoking status: Never Smoker   . Smokeless tobacco: Never Used  . Alcohol Use: 0.0 oz/week    0 Standard drinks or equivalent per week     Comment: very rare social  . Drug Use: No  . Sexual Activity:    Partners: Male    Birth Control/ Protection: Surgical   Other Topics Concern  . Not on file   Social History Narrative      ROS:  General: Negative for anorexia, weight loss, fever, chills, fatigue, weakness. Eyes: Negative for vision changes.  ENT: Negative for hoarseness, difficulty swallowing , nasal congestion. CV: Negative for chest pain, angina, palpitations, dyspnea on exertion, peripheral edema.  Respiratory: Negative for dyspnea at rest, dyspnea on exertion, cough, sputum, wheezing.  GI: See history of present illness. GU:  Negative for dysuria, hematuria, urinary incontinence, urinary frequency, nocturnal urination.  MS: Negative for joint pain, low back pain.  Derm: Negative for rash or itching.  Neuro: Negative for weakness, abnormal sensation, seizure, frequent headaches, memory loss, confusion.  Psych: Negative for anxiety, depression, suicidal ideation, hallucinations.  Endo: Negative for unusual weight change.  Heme: Negative for bruising or bleeding. Allergy: Negative for rash or hives.    Physical Examination:  BP 128/85 mmHg  Pulse 82  Temp(Src) 97.3 F (36.3 C)  Ht 5\' 4"  (1.626 m)  Wt 214 lb 3.2 oz (97.16 kg)  BMI 36.75 kg/m2   General: Well-nourished, well-developed in no acute distress.  Head: Normocephalic, atraumatic.   Eyes: Conjunctiva pink, no icterus. Mouth: Oropharyngeal mucosa moist and pink , no lesions erythema or exudate. Neck: Supple without thyromegaly, masses, or lymphadenopathy.  Lungs: Clear to auscultation bilaterally.  Heart: Regular rate and rhythm, no murmurs rubs or gallops.  Abdomen: Bowel sounds are normal, moderate epigastric/luq tenderness, "pressure" in  mid-abd, nondistended, no hepatosplenomegaly or masses, no abdominal bruits or    hernia , no rebound or guarding.   Rectal: not performed Extremities: No lower extremity edema. No clubbing or deformities.  Neuro: Alert and oriented x 4 , grossly normal neurologically.  Skin: Warm and dry, no rash or jaundice.   Psych: Alert and cooperative, normal mood and affect.  Labs: Lab Results  Component Value Date   WBC 7.5 12/10/2014   HGB 15.9 12/09/2014   HCT 40.1 12/10/2014   MCV 88.8 08/04/2014   PLT 245 08/04/2014   Lab Results  Component Value Date   CREATININE 0.61 12/10/2014   BUN 17 12/10/2014   NA  140 12/10/2014   K 4.8 12/10/2014   CL 102 12/10/2014   CO2 24 12/10/2014   Lab Results  Component Value Date   ALT 30 12/10/2014   AST 21 12/10/2014   ALKPHOS 67 12/10/2014   BILITOT 0.2 12/10/2014   Lab Results  Component Value Date   LIPASE 47 12/10/2014     Imaging Studies: No results found.  Impression/Plan:  49 year old female who presents with multiple GI complaints. Recent 2 week history of reported melena, bright red blood per rectum in the setting of worsening abdominal pain/bloating. Denies typical heartburn symptoms. She has a history of peptic ulcer disease. Complains of worsening early satiety, postprandial bloating, nausea with intermittent vomiting. 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 Stools are soft. She does not go daily. Recently felt like her abdomen is bloated/distended more so on the right side.  Given her history of peptic ulcer disease, black stool, blood in the stool we have offered her an upper endoscopy and colonoscopy for further evaluation. Family history significant for IBD and colon cancer as outlined above. Last EGD remote, last colonoscopy 7 years ago. She reports being unable to take GoLYTELY because of vomiting. She will  be given Suprep with 2 days of clear liquids.  I have discussed the risks, alternatives, benefits with regards to but not limited to the risk of reaction to medication, bleeding, infection, perforation and the patient is agreeable to proceed. Written consent to be obtained.  Of note, she has a history of fatty liver, gallbladder remains in situ as well. If EGD is unrevealing, she may need to have further evaluation for biliary disease. Further recommendations to follow.

## 2015-01-11 NOTE — Progress Notes (Signed)
cc'ed to pcp °

## 2015-01-11 NOTE — Patient Instructions (Signed)
Colonoscopy and upper endoscopy with Dr. Rourk. See separate instructions.  

## 2015-02-03 ENCOUNTER — Encounter: Payer: BC Managed Care – PPO | Attending: Nurse Practitioner | Admitting: Nutrition

## 2015-02-03 ENCOUNTER — Encounter: Payer: BC Managed Care – PPO | Admitting: Nutrition

## 2015-02-03 VITALS — Ht 64.0 in | Wt 213.4 lb

## 2015-02-03 DIAGNOSIS — Z713 Dietary counseling and surveillance: Secondary | ICD-10-CM | POA: Insufficient documentation

## 2015-02-03 DIAGNOSIS — Z6836 Body mass index (BMI) 36.0-36.9, adult: Secondary | ICD-10-CM | POA: Diagnosis not present

## 2015-02-03 DIAGNOSIS — E669 Obesity, unspecified: Secondary | ICD-10-CM

## 2015-02-03 NOTE — Patient Instructions (Signed)
Goals 1.  Follow the Plate Method 2. Cut out snacks between meals. 3. Increase fresh fruits and vegetables. 4. Drink 1 glass of water before each meals. 5. Exercise 30 minutes a day. 6. Measure foods out for portion control. 7. Lose 1 lb per week.  

## 2015-02-03 NOTE — Progress Notes (Signed)
  Medical Nutrition Therapy:  Appt start time: 1600 end time:  1615.  Assessment:  Primary concerns today: Overweight. Has done better with portion control most of the time.. Scheduled to have an EGD and colonoscopy tomorrow. Has only been on liquids. Didn't lose any weight. Doesn't seem to be committed to making adequate changes with diet and exercise to lose weight. She says she has cut out soda and drinking water before meals some of the time. Still eating high fat and sodium foods. Diet is low in fresh fruits and vegetables and fiber. Needs to increase physical activity, avoid snacks and reduce portions. Lab Results  Component Value Date   HGBA1C 5.4 09/29/2013   Wt Readings from Last 3 Encounters:  01/11/15 214 lb 3.2 oz (97.16 kg)  01/05/15 214 lb 2 oz (97.126 kg)  12/24/14 213 lb 3.2 oz (96.707 kg)   Ht Readings from Last 3 Encounters:  01/11/15 5\' 4"  (1.626 m)  01/05/15 5\' 4"  (1.626 m)  12/24/14 5\' 4"  (1.626 m)   There is no weight on file to calculate BMI.  Preferred Learning Style:   No preference indicated   Learning Readiness:   Ready  Change in progress   MEDICATIONS:    DIETARY INTAKE:  24-hr recall:  B ( AM):  BIscuit-sausage and water; Egg or yogurt and water Snk ( AM): few cashews or PB L ( PM): School lunch, water Snk ( PM): 1 decaf hot tea D ( PM):  Spaghetti with meat sauce, water Snk ( PM):  1 milk Beverages: water  Usual physical activity: 20 minutes on the bike three times per week.  Estimated energy needs: 1500 calories 170 g carbohydrates 112 g protein 42 g fat  Progress Towards Goal(s):  In progress.   Nutritional Diagnosis:  NB-1.1 Food and nutrition-related knowledge deficit As related to Obesity.  As evidenced by BMI>30.    Intervention:  Nutrition counseling for weight loss, meal planning, portion sizes, timing of meals, avoiding snacks, My Plate, avoiding processed foods and exercising 30 minutes per day..  Goals 1.  Follow  the Plate Method 2. Cut out snacks between meals. 3. Increase fresh fruits and vegetables. 4. Drink 1 glass of water before each meals. 5. Exercise 30 minutes a day. 6. Measure foods out for portion control. 7. Lose 1 lb per week til next visit..  Teaching Method Utilized:  Visual Auditory Hands on  Handouts given during visit include:  MY Plate  Meal Plan Card  Strategies for weight loss  Barriers to learning/adherence to lifestyle change: None   Demonstrated degree of understanding via:  Teach Back   Monitoring/Evaluation:  Dietary intake, exercise, meal planning, SBG, and body weight in 1 month(s).

## 2015-02-04 ENCOUNTER — Encounter (HOSPITAL_COMMUNITY): Payer: Self-pay | Admitting: *Deleted

## 2015-02-04 ENCOUNTER — Telehealth: Payer: Self-pay

## 2015-02-04 ENCOUNTER — Encounter (HOSPITAL_COMMUNITY)
Admission: RE | Disposition: A | Discharge status: Home / Self Care | Payer: Self-pay | Source: Ambulatory Visit | Attending: Internal Medicine

## 2015-02-04 ENCOUNTER — Ambulatory Visit (HOSPITAL_COMMUNITY)
Admission: RE | Admit: 2015-02-04 | Discharge: 2015-02-04 | Disposition: A | Discharge status: Home / Self Care | Payer: BC Managed Care – PPO | Source: Ambulatory Visit | Attending: Internal Medicine | Admitting: Internal Medicine

## 2015-02-04 DIAGNOSIS — K642 Third degree hemorrhoids: Secondary | ICD-10-CM | POA: Diagnosis not present

## 2015-02-04 DIAGNOSIS — K921 Melena: Secondary | ICD-10-CM

## 2015-02-04 DIAGNOSIS — R1110 Vomiting, unspecified: Secondary | ICD-10-CM | POA: Diagnosis not present

## 2015-02-04 DIAGNOSIS — R1013 Epigastric pain: Secondary | ICD-10-CM | POA: Insufficient documentation

## 2015-02-04 DIAGNOSIS — F419 Anxiety disorder, unspecified: Secondary | ICD-10-CM | POA: Diagnosis not present

## 2015-02-04 DIAGNOSIS — R112 Nausea with vomiting, unspecified: Secondary | ICD-10-CM

## 2015-02-04 DIAGNOSIS — K2100 Gastro-esophageal reflux disease with esophagitis, without bleeding: Secondary | ICD-10-CM | POA: Insufficient documentation

## 2015-02-04 DIAGNOSIS — K625 Hemorrhage of anus and rectum: Secondary | ICD-10-CM

## 2015-02-04 DIAGNOSIS — R6881 Early satiety: Secondary | ICD-10-CM | POA: Diagnosis not present

## 2015-02-04 DIAGNOSIS — Z800 Family history of malignant neoplasm of digestive organs: Secondary | ICD-10-CM | POA: Diagnosis not present

## 2015-02-04 DIAGNOSIS — I10 Essential (primary) hypertension: Secondary | ICD-10-CM | POA: Diagnosis not present

## 2015-02-04 DIAGNOSIS — K219 Gastro-esophageal reflux disease without esophagitis: Secondary | ICD-10-CM | POA: Insufficient documentation

## 2015-02-04 DIAGNOSIS — Z801 Family history of malignant neoplasm of trachea, bronchus and lung: Secondary | ICD-10-CM | POA: Insufficient documentation

## 2015-02-04 DIAGNOSIS — R140 Abdominal distension (gaseous): Secondary | ICD-10-CM | POA: Insufficient documentation

## 2015-02-04 DIAGNOSIS — Z79899 Other long term (current) drug therapy: Secondary | ICD-10-CM | POA: Insufficient documentation

## 2015-02-04 DIAGNOSIS — K649 Unspecified hemorrhoids: Secondary | ICD-10-CM | POA: Insufficient documentation

## 2015-02-04 HISTORY — PX: COLONOSCOPY: SHX5424

## 2015-02-04 HISTORY — PX: ESOPHAGOGASTRODUODENOSCOPY: SHX5428

## 2015-02-04 SURGERY — COLONOSCOPY
Anesthesia: Moderate Sedation

## 2015-02-04 MED ORDER — ONDANSETRON HCL 4 MG/2ML IJ SOLN
INTRAMUSCULAR | Status: DC | PRN
  Administered 2015-02-04: 4 mg via INTRAVENOUS

## 2015-02-04 MED ORDER — ONDANSETRON HCL 4 MG/2ML IJ SOLN
INTRAMUSCULAR | Status: AC
  Filled 2015-02-04: qty 2

## 2015-02-04 MED ORDER — MEPERIDINE HCL 100 MG/ML IJ SOLN
INTRAMUSCULAR | Status: AC
  Filled 2015-02-04: qty 2

## 2015-02-04 MED ORDER — LIDOCAINE VISCOUS 2 % MT SOLN
OROMUCOSAL | Status: DC | PRN
  Administered 2015-02-04: 1 via OROMUCOSAL

## 2015-02-04 MED ORDER — SODIUM CHLORIDE 0.9 % IV SOLN
INTRAVENOUS | Status: DC
  Administered 2015-02-04: 1000 mL via INTRAVENOUS

## 2015-02-04 MED ORDER — STERILE WATER FOR IRRIGATION IR SOLN
Status: DC | PRN
  Administered 2015-02-04: 10:00:00

## 2015-02-04 MED ORDER — MIDAZOLAM HCL 5 MG/5ML IJ SOLN
INTRAMUSCULAR | Status: DC | PRN
  Administered 2015-02-04 (×4): 1 mg via INTRAVENOUS
  Administered 2015-02-04: 2 mg via INTRAVENOUS

## 2015-02-04 MED ORDER — MIDAZOLAM HCL 5 MG/5ML IJ SOLN
INTRAMUSCULAR | Status: AC
  Filled 2015-02-04: qty 10

## 2015-02-04 MED ORDER — MEPERIDINE HCL 100 MG/ML IJ SOLN
INTRAMUSCULAR | Status: DC | PRN
  Administered 2015-02-04 (×2): 25 mg via INTRAVENOUS
  Administered 2015-02-04: 50 mg via INTRAVENOUS
  Administered 2015-02-04: 25 mg via INTRAVENOUS

## 2015-02-04 MED ORDER — LIDOCAINE VISCOUS 2 % MT SOLN
OROMUCOSAL | Status: DC
Start: 2015-02-04 — End: 2015-02-04
  Filled 2015-02-04: qty 15

## 2015-02-04 NOTE — Op Note (Signed)
[facility] [address] [place], [id number]   COLONOSCOPY PROCEDURE REPORT  PATIENT: Mackenzie Gray, Mackenzie Gray  MR#: [id number] BIRTHDATE: Mar 07, 1965 , 49  yrs. old GENDER: female ENDOSCOPIST: R.  Roetta SessionsMichael Rourk, MD FACP [facility] REFERRED AO:ZHYQMVHBY:Stephen Gerda DissLuking, M.Gray. PROCEDURE DATE:  02/04/2015 PROCEDURE:   Ileo-colonoscopy, diagnostic INDICATIONS:  Hematochezia MEDICATIONS: Versed 6 mg IV and Demerol 125 mg IV in divided doses. Zofran 4 mg IV. ASA CLASS:       Class II  CONSENT: The risks, benefits, alternatives and imponderables including but not limited to bleeding, perforation as well as the possibility of a missed lesion have been reviewed.  The potential for biopsy, lesion removal, etc. have also been discussed. Questions have been answered.  All parties agreeable.  Please see the history and physical in the medical record for more information.  DESCRIPTION OF PROCEDURE:   After the risks benefits and alternatives of the procedure were thoroughly explained, informed consent was obtained.  The digital rectal exam revealed no abnormalities of the rectum.   The [id number] ([id number]  endoscope was introduced through the anus and advanced to the terminal ileum which was intubated for a short distance. No adverse events experienced.   The quality of the prep was adequate  The instrument was then slowly withdrawn as the colon was fully examined. Estimated blood loss is zero unless otherwise noted in this procedure report.      COLON FINDINGS: Grade 2-3 hemorrhoids; otherwise, normal rectal mucosa.  Normal-appearing colonic mucosa.  The distal 5 cm of terminal ileum mucosa also appeared normal.  Retroflexion was performed. .  Withdrawal time=6 minutes 0 seconds.  The scope was withdrawn and the procedure completed. COMPLICATIONS: There were no immediate complications.  ENDOSCOPIC IMPRESSION: Grade 2-3 hemorrhoids?"likely source of hematochezia; otherwise normal  Ileo-colonoscopy  RECOMMENDATIONS: Trial of linzess 145 every other day.  Office visit in 4-6 weeks?"may be a reasonably good hemorrhoid banding candidate. See EGD report  eSigned:  R. Roetta SessionsMichael Rourk, MD Jerrel IvoryFACP [facility] 02/04/2015 10:44 AM   cc:  CPT CODES: ICD CODES:  The ICD and CPT codes recommended by this software are interpretations from the data that the clinical staff has captured with the software.  The verification of the translation of this report to the ICD and CPT codes and modifiers is the sole responsibility of the health care institution and practicing physician where this report was generated.  [employer] will not be held responsible for the validity of the ICD and CPT codes included on this report.  AMA assumes no liability for data contained or not contained herein. CPT is a Publishing rights managerregistered trademark of the [employer].  PATIENT NAME:  Mackenzie Gray, Mackenzie Gray MR#: [id number]

## 2015-02-04 NOTE — Discharge Instructions (Signed)
Colonoscopy Discharge Instructions  Read the instructions outlined below and refer to this sheet in the next few weeks. These discharge instructions provide you with general information on caring for yourself after you leave the hospital. Your doctor may also give you specific instructions. While your treatment has been planned according to the most current medical practices available, unavoidable complications occasionally occur. If you have any problems or questions after discharge, call Dr. Gala Romney at [phone]. ACTIVITY  You may resume your regular activity, but move at a slower pace for the next 24 hours.   Take frequent rest periods for the next 24 hours.   Walking will help get rid of the air and reduce the bloated feeling in your belly (abdomen).   No driving for 24 hours (because of the medicine (anesthesia) used during the test).    Do not sign any important legal documents or operate any machinery for 24 hours (because of the anesthesia used during the test).  NUTRITION  Drink plenty of fluids.   You may resume your normal diet as instructed by your doctor.   Begin with a light meal and progress to your normal diet. Heavy or fried foods are harder to digest and may make you feel sick to your stomach (nauseated).   Avoid alcoholic beverages for 24 hours or as instructed.  MEDICATIONS  You may resume your normal medications unless your doctor tells you otherwise.  WHAT YOU CAN EXPECT TODAY  Some feelings of bloating in the abdomen.   Passage of more gas than usual.   Spotting of blood in your stool or on the toilet paper.  IF YOU HAD POLYPS REMOVED DURING THE COLONOSCOPY:  No aspirin products for 7 days or as instructed.   No alcohol for 7 days or as instructed.   Eat a soft diet for the next 24 hours.  FINDING OUT THE RESULTS OF YOUR TEST Not all test results are available during your visit. If your test results are not back during the visit, make an appointment  with your caregiver to find out the results. Do not assume everything is normal if you have not heard from your caregiver or the medical facility. It is important for you to follow up on all of your test results.  SEEK IMMEDIATE MEDICAL ATTENTION IF:  You have more than a spotting of blood in your stool.   Your belly is swollen (abdominal distention).   You are nauseated or vomiting.   You have a temperature over 101.  You have abdominal pain or discomfort that is severe or gets worse throughout the day. EGD Discharge instructions Please read the instructions outlined below and refer to this sheet in the next few weeks. These discharge instructions provide you with general information on caring for yourself after you leave the hospital. Your doctor may also give you specific instructions. While your treatment has been planned according to the most current medical practices available, unavoidable complications occasionally occur. If you have any problems or questions after discharge, please call your doctor. ACTIVITY You may resume your regular activity but move at a slower pace for the next 24 hours.  Take frequent rest periods for the next 24 hours.  Walking will help expel (get rid of) the air and reduce the bloated feeling in your abdomen.  No driving for 24 hours (because of the anesthesia (medicine) used during the test).  You may shower.  Do not sign any important legal documents or operate any machinery for 24  hours (because of the anesthesia used during the test).  NUTRITION Drink plenty of fluids.  You may resume your normal diet.  Begin with a light meal and progress to your normal diet.  Avoid alcoholic beverages for 24 hours or as instructed by your caregiver.  MEDICATIONS You may resume your normal medications unless your caregiver tells you otherwise.  WHAT YOU CAN EXPECT TODAY You may experience abdominal discomfort such as a feeling of fullness or gas pains.   FOLLOW-UP Your doctor will discuss the results of your test with you.  SEEK IMMEDIATE MEDICAL ATTENTION IF ANY OF THE FOLLOWING OCCUR: Excessive nausea (feeling sick to your stomach) and/or vomiting.  Severe abdominal pain and distention (swelling).  Trouble swallowing.  Temperature over 101 F (37.8 C).  Rectal bleeding or vomiting of blood.    GERD, hemorrhoid and constipation information provided  Begin Dexilant 60 mg daily - go by my office for free samples-this is for reflux  Begin Linzess 145 -  one capsule every other day for constipation-go by my office for free samples  Office visit with me in 4-6 weeks for consideration of hemorrhoid banding Constipation, Adult Constipation is when a person has fewer than three bowel movements a week, has difficulty having a bowel movement, or has stools that are dry, hard, or larger than normal. As people grow older, constipation is more common. A low-fiber diet, not taking in enough fluids, and taking certain medicines may make constipation worse.  CAUSES   Certain medicines, such as antidepressants, pain medicine, iron supplements, antacids, and water pills.   Certain diseases, such as diabetes, irritable bowel syndrome (IBS), thyroid disease, or depression.   Not drinking enough water.   Not eating enough fiber-rich foods.   Stress or travel.   Lack of physical activity or exercise.   Ignoring the urge to have a bowel movement.   Using laxatives too much.  SIGNS AND SYMPTOMS   Having fewer than three bowel movements a week.   Straining to have a bowel movement.   Having stools that are hard, dry, or larger than normal.   Feeling full or bloated.   Pain in the lower abdomen.   Not feeling relief after having a bowel movement.  DIAGNOSIS  Your health care provider will take a medical history and perform a physical exam. Further testing may be done for severe constipation. Some tests may include:  A  barium enema X-ray to examine your rectum, colon, and, sometimes, your small intestine.   A sigmoidoscopy to examine your lower colon.   A colonoscopy to examine your entire colon. TREATMENT  Treatment will depend on the severity of your constipation and what is causing it. Some dietary treatments include drinking more fluids and eating more fiber-rich foods. Lifestyle treatments may include regular exercise. If these diet and lifestyle recommendations do not help, your health care provider may recommend taking over-the-counter laxative medicines to help you have bowel movements. Prescription medicines may be prescribed if over-the-counter medicines do not work.  HOME CARE INSTRUCTIONS   Eat foods that have a lot of fiber, such as fruits, vegetables, whole grains, and beans.  Limit foods high in fat and processed sugars, such as french fries, hamburgers, cookies, candies, and soda.   A fiber supplement may be added to your diet if you cannot get enough fiber from foods.   Drink enough fluids to keep your urine clear or pale yellow.   Exercise regularly or as directed by your health care  provider.   Go to the restroom when you have the urge to go. Do not hold it.   Only take over-the-counter or prescription medicines as directed by your health care provider. Do not take other medicines for constipation without talking to your health care provider first.  SEEK IMMEDIATE MEDICAL CARE IF:   You have bright red blood in your stool.   Your constipation lasts for more than 4 days or gets worse.   You have abdominal or rectal pain.   You have thin, pencil-like stools.   You have unexplained weight loss. MAKE SURE YOU:   Understand these instructions.  Will watch your condition.  Will get help right away if you are not doing well or get worse.   This information is not intended to replace advice given to you by your health care provider. Make sure you discuss any questions  you have with your health care provider.   Document Released: 11/18/2003 Document Revised: 03/12/2014 Document Reviewed: 12/01/2012 Elsevier Interactive Patient Education 2016 [employer]  Hemorrhoids Hemorrhoids are swollen veins around the rectum or anus. There are two types of hemorrhoids:   Internal hemorrhoids. These occur in the veins just inside the rectum. They may poke through to the outside and become irritated and painful.  External hemorrhoids. These occur in the veins outside the anus and can be felt as a painful swelling or hard lump near the anus. CAUSES  Pregnancy.   Obesity.   Constipation or diarrhea.   Straining to have a bowel movement.   Sitting for long periods on the toilet.  Heavy lifting or other activity that caused you to strain.  Anal intercourse. SYMPTOMS   Pain.   Anal itching or irritation.   Rectal bleeding.   Fecal leakage.   Anal swelling.   One or more lumps around the anus.  DIAGNOSIS  Your caregiver may be able to diagnose hemorrhoids by visual examination. Other examinations or tests that may be performed include:   Examination of the rectal area with a gloved hand (digital rectal exam).   Examination of anal canal using a small tube (scope).   A blood test if you have lost a significant amount of blood.  A test to look inside the colon (sigmoidoscopy or colonoscopy). TREATMENT Most hemorrhoids can be treated at home. However, if symptoms do not seem to be getting better or if you have a lot of rectal bleeding, your caregiver may perform a procedure to help make the hemorrhoids get smaller or remove them completely. Possible treatments include:   Placing a rubber band at the base of the hemorrhoid to cut off the circulation (rubber band ligation).   Injecting a chemical to shrink the hemorrhoid (sclerotherapy).   Using a tool to burn the hemorrhoid (infrared light therapy).   Surgically removing the  hemorrhoid (hemorrhoidectomy).   Stapling the hemorrhoid to block blood flow to the tissue (hemorrhoid stapling).  HOME CARE INSTRUCTIONS   Eat foods with fiber, such as whole grains, beans, nuts, fruits, and vegetables. Ask your doctor about taking products with added fiber in them (fibersupplements).  Increase fluid intake. Drink enough water and fluids to keep your urine clear or pale yellow.   Exercise regularly.   Go to the bathroom when you have the urge to have a bowel movement. Do not wait.   Avoid straining to have bowel movements.   Keep the anal area dry and clean. Use wet toilet paper or moist towelettes after a bowel movement.  Medicated creams and suppositories may be used or applied as directed.   Only take over-the-counter or prescription medicines as directed by your caregiver.   Take warm sitz baths for 15-20 minutes, 3-4 times a day to ease pain and discomfort.   Place ice packs on the hemorrhoids if they are tender and swollen. Using ice packs between sitz baths may be helpful.   Put ice in a plastic bag.   Place a towel between your skin and the bag.   Leave the ice on for 15-20 minutes, 3-4 times a day.   Do not use a donut-shaped pillow or sit on the toilet for long periods. This increases blood pooling and pain.  SEEK MEDICAL CARE IF:  You have increasing pain and swelling that is not controlled by treatment or medicine.  You have uncontrolled bleeding.  You have difficulty or you are unable to have a bowel movement.  You have pain or inflammation outside the area of the hemorrhoids. MAKE SURE YOU:  Understand these instructions.  Will watch your condition.  Will get help right away if you are not doing well or get worse.   This information is not intended to replace advice given to you by your health care provider. Make sure you discuss any questions you have with your health care provider.   Document Released: 02/17/2000  Document Revised: 02/06/2012 Document Reviewed: 12/25/2011 Elsevier Interactive Patient Education 2016 Elsevier Inc.  Gastroesophageal Reflux Disease, Adult Normally, food travels down the esophagus and stays in the stomach to be digested. However, when a person has gastroesophageal reflux disease (GERD), food and stomach acid move back up into the esophagus. When this happens, the esophagus becomes sore and inflamed. Over time, GERD can create small holes (ulcers) in the lining of the esophagus.  CAUSES This condition is caused by a problem with the muscle between the esophagus and the stomach (lower esophageal sphincter, or LES). Normally, the LES muscle closes after food passes through the esophagus to the stomach. When the LES is weakened or abnormal, it does not close properly, and that allows food and stomach acid to go back up into the esophagus. The LES can be weakened by certain dietary substances, medicines, and medical conditions, including: Tobacco use. Pregnancy. Having a hiatal hernia. Heavy alcohol use. Certain foods and beverages, such as coffee, chocolate, onions, and peppermint. RISK FACTORS This condition is more likely to develop in: People who have an increased body weight. People who have connective tissue disorders. People who use NSAID medicines. SYMPTOMS Symptoms of this condition include: Heartburn. Difficult or painful swallowing. The feeling of having a lump in the throat. Abitter taste in the mouth. Bad breath. Having a large amount of saliva. Having an upset or bloated stomach. Belching. Chest pain. Shortness of breath or wheezing. Ongoing (chronic) cough or a night-time cough. Wearing away of tooth enamel. Weight loss. Different conditions can cause chest pain. Make sure to see your health care provider if you experience chest pain. DIAGNOSIS Your health care provider will take a medical history and perform a physical exam. To determine if you have  mild or severe GERD, your health care provider may also monitor how you respond to treatment. You may also have other tests, including: An endoscopy toexamine your stomach and esophagus with a small camera. A test thatmeasures the acidity level in your esophagus. A test thatmeasures how much pressure is on your esophagus. A barium swallow or modified barium swallow to show the shape, size, and  functioning of your esophagus. TREATMENT The goal of treatment is to help relieve your symptoms and to prevent complications. Treatment for this condition may vary depending on how severe your symptoms are. Your health care provider may recommend: Changes to your diet. Medicine. Surgery. HOME CARE INSTRUCTIONS Diet Follow a diet as recommended by your health care provider. This may involve avoiding foods and drinks such as: Coffee and tea (with or without caffeine). Drinks that containalcohol. Energy drinks and sports drinks. Carbonated drinks or sodas. Chocolate and cocoa. Peppermint and mint flavorings. Garlic and onions. Horseradish. Spicy and acidic foods, including peppers, chili powder, curry powder, vinegar, hot sauces, and barbecue sauce. Citrus fruit juices and citrus fruits, such as oranges, lemons, and limes. Tomato-based foods, such as red sauce, chili, salsa, and pizza with red sauce. Fried and fatty foods, such as donuts, french fries, potato chips, and high-fat dressings. High-fat meats, such as hot dogs and fatty cuts of red and white meats, such as rib eye steak, sausage, ham, and bacon. High-fat dairy items, such as whole milk, butter, and cream cheese. Eat small, frequent meals instead of large meals. Avoid drinking large amounts of liquid with your meals. Avoid eating meals during the 2-3 hours before bedtime. Avoid lying down right after you eat. Do not exercise right after you eat. General Instructions Pay attention to any changes in your symptoms. Take  over-the-counter and prescription medicines only as told by your health care provider. Do not take aspirin, ibuprofen, or other NSAIDs unless your health care provider told you to do so. Do not use any tobacco products, including cigarettes, chewing tobacco, and e-cigarettes. If you need help quitting, ask your health care provider. Wear loose-fitting clothing. Do not wear anything tight around your waist that causes pressure on your abdomen. Raise (elevate) the head of your bed 6 inches (15cm). Try to reduce your stress, such as with yoga or meditation. If you need help reducing stress, ask your health care provider. If you are overweight, reduce your weight to an amount that is healthy for you. Ask your health care provider for guidance about a safe weight loss goal. Keep all follow-up visits as told by your health care provider. This is important. SEEK MEDICAL CARE IF: You have new symptoms. You have unexplained weight loss. You have difficulty swallowing, or it hurts to swallow. You have wheezing or a persistent cough. Your symptoms do not improve with treatment. You have a hoarse voice. SEEK IMMEDIATE MEDICAL CARE IF: You have pain in your arms, neck, jaw, teeth, or back. You feel sweaty, dizzy, or light-headed. You have chest pain or shortness of breath. You vomit and your vomit looks like blood or coffee grounds. You faint. Your stool is bloody or black. You cannot swallow, drink, or eat.   This information is not intended to replace advice given to you by your health care provider. Make sure you discuss any questions you have with your health care provider.   Document Released: 11/29/2004 Document Revised: 11/10/2014 Document Reviewed: 06/16/2014 Elsevier Interactive Patient Education [employer]

## 2015-02-04 NOTE — Op Note (Signed)
[facility] [address] [place], [id number]   ENDOSCOPY PROCEDURE REPORT  PATIENT: Mackenzie Gray, Taj D  MR#: [id number] BIRTHDATE: [birth year] , 49  yrs. old GENDER: female ENDOSCOPIST: R.  Roetta SessionsMichael Arless Vineyard, MD FACP FACG REFERRED BY:  Simone CuriaStephen Luking, M.D. PROCEDURE DATE:  02/04/2015 PROCEDURE:  EGD, diagnostic INDICATIONS:  GERD/dyspepsia. MEDICATIONS: Versed 4 mg IV and Demerol 100 mg IV in divided doses. Xylocaine gel orally.  Zofran 4 mg IV. ASA CLASS:      Class II  CONSENT: The risks, benefits, limitations, alternatives and imponderables have been discussed.  The potential for biopsy, esophogeal dilation, etc. have also been reviewed.  Questions have been answered.  All parties agreeable.  Please see the history and physical in the medical record for more information.  DESCRIPTION OF PROCEDURE: After the risks benefits and alternatives of the procedure were thoroughly explained, informed consent was obtained.  The EG-[id number] ([id number] endoscope was introduced through the mouth and advanced to the second portion of the duodenum , limited by Without limitations. The instrument was slowly withdrawn as the mucosa was fully examined. Estimated blood loss is zero unless otherwise noted in this procedure report.    Circumferential distal esophageal erosions within 5 mm of the GE junction.  No Barrett's epithelium seen.  Tubular esophagus patent throughout its course.  Stomach empty.  Normal-appearing gastric mucosa.  No hiatal hernia identified.  Patent pylorus. Normal-appearing first and second portion of the duodenum. Retroflexed views revealed no abnormalities.     The scope was then withdrawn from the patient and the procedure completed.  COMPLICATIONS: There were no immediate complications.  ENDOSCOPIC IMPRESSION: Erosive reflux esophagitis.  RECOMMENDATIONS: Begin Dexilant 60 mg daily?"patient is to go by my office for a 3 week supply of samples. See  colonoscopy report.  REPEAT EXAM:  eSigned:  R. Roetta SessionsMichael Kerstyn Coryell, MD Jerrel IvoryFACP [facility] Center[facility]ACG 02/04/2015 10:22 AM    CC:  CPT CODES: ICD CODES:  The ICD and CPT codes recommended by this software are interpretations from the data that the clinical staff has captured with the software.  The verification of the translation of this report to the ICD and CPT codes and modifiers is the sole responsibility of the health care institution and practicing physician where this report was generated.  [employer] will not be held responsible for the validity of the ICD and CPT codes included on this report.  AMA assumes no liability for data contained or not contained herein. CPT is a Publishing rights managerregistered trademark of the [employer].  PATIENT NAME:  Mackenzie Gray, Shelton D MR#: [id number]

## 2015-02-04 NOTE — Interval H&P Note (Signed)
History and Physical Interval Note:  02/04/2015 10:01 AM  Mackenzie Gray  has presented today for surgery, with the diagnosis of MELENA/N/V/ABD PAIN/RECTAL BLEEDING  The various methods of treatment have been discussed with the patient and family. After consideration of risks, benefits and other options for treatment, the patient has consented to  Procedure(s) with comments: COLONOSCOPY (N/A) - 1000 ESOPHAGOGASTRODUODENOSCOPY (EGD) (N/A) as a surgical intervention .  The patient's history has been reviewed, patient examined, no change in status, stable for surgery.  I have reviewed the patient's chart and labs.  Questions were answered to the patient's satisfaction.     Mackenzie Gray  No change. No dysphagia. Diagnostic EGD and colonoscopy per plan.  The risks, benefits, limitations, imponderables and alternatives regarding both EGD and colonoscopy have been reviewed with the patient. Questions have been answered. All parties agreeable.

## 2015-02-04 NOTE — Telephone Encounter (Signed)
Per Dr. Jena Gaussourk :   Dexilant 60 mg #20 given to take one tablet daily for 3 weeks.  Linzess 145 mcg #8 tablets to take one tablet every other day for 2 weeks.  Hemorrhoid information and questionaire at front for pt also.

## 2015-02-04 NOTE — H&P (View-Only) (Signed)
Primary Care Physician:  Lubertha SouthSteve Luking, MD  Primary Gastroenterologist:  Roetta SessionsMichael Rourk, MD   Chief Complaint  Patient presents with  . Blood In Stools    HPI:  Mackenzie Gray is a 49 y.o. female here at the request of Dr. Gerda DissLuking for further evaluation of blood in the stool as well as other GI complaints.   Patient seen previously by our practice but has been since 2011. She has h/o GERD/IBS, PUD. See PSH for prior EGD/TCS.  No heartburn. No dysphagia. PP nausea. A lot of mid-abdominal bloating. Worse with meals. Early satiety. Some vomiting. Worse last couple of months. BM may skip few days and then some days several BMs. Stool softeners don't help. Soft small stools when go. Very thin. Right side of abdomen more distended. 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 She also had recent two week history of melena and brbpr with each BM. No Pepto-bismol. Makes vomiting. Denies ASA/NSAIDs. Heme positive on DRE by Dr. Gerda DissLuking.    Current Outpatient Prescriptions  Medication Sig Dispense Refill  . albuterol (PROVENTIL HFA;VENTOLIN HFA) 108 (90 BASE) MCG/ACT inhaler Inhale 2 puffs into the lungs every 6 (six) hours as needed for wheezing. 1 Inhaler 2  . ALPRAZolam (XANAX) 0.5 MG tablet TAKE 1 TABLET BY MOUTH ONCE DAILY AS NEEDED FOR ANXIETY. 24 tablet 0  . diazepam (VALIUM) 5 MG tablet Take 2.5-5 mg by mouth as needed for muscle spasms.     Marland Kitchen. estradiol (ESTRACE) 2 MG tablet Take 1 tablet (2 mg total) by mouth daily. 30 tablet 11  . Melatonin 1 MG CAPS Take 1-18 mg by mouth at bedtime as needed (sleep).     . neomycin-polymyxin-hydrocortisone (CORTISPORIN) otic solution Place 4 drops into the right ear 4 (four) times daily. 10 mL 2  . triamterene-hydrochlorothiazide (DYAZIDE) 37.5-25 MG per capsule Take 1 each (1 capsule total) by mouth daily. 30 capsule 5   No current facility-administered medications for this visit.    Allergies as of 01/11/2015 - Review Complete 01/11/2015  Allergen  Reaction Noted  . Phentermine Swelling 10/25/2014  . Codeine Itching   . Morphine Itching   . Other Itching 03/22/2014  . Tramadol Swelling 04/30/2014    Past Medical History  Diagnosis Date  . Migraines   . ADD (attention deficit disorder)     Adult  . GERD (gastroesophageal reflux disease)   . IBS (irritable bowel syndrome)   . Hypoestrogenism   . Hypertension   . Bronchitis   . Anxiety   . PONV (postoperative nausea and vomiting)   . Fatty liver     CT 2013    Past Surgical History  Procedure Laterality Date  . Abdominal hysterectomy    . Disc removal 5-6 vert    . Esophagogastroduodenoscopy    . Colonoscopy    . Total abdominal hysterectomy w/ bilateral salpingoophorectomy Left   . Tubal ligation    . Carpal tunnel release Left 09/09/2014    Procedure: LEFT CARPAL TUNNEL RELEASE;  Surgeon: Betha LoaKevin Kuzma, MD;  Location: [facility];  Service: Orthopedics;  Laterality: Left;  . Colonoscopy  10/2007    RMR: normal TI, normal colon, internal hemorrhoids  . Colonoscopy with esophagogastroduodenoscopy (egd)  2004    RMR: prepyloric ulcer, negative bx, internal hemorrhoids    Family History  Problem Relation Age of Onset  . Heart disease    . Arthritis    . Lung disease    . Cancer    .  Asthma    . Diabetes    . Kidney disease    . Diabetes Mother     Borderline  . Hyperlipidemia Mother   . Stroke Mother   . Heart disease Mother   . COPD Mother   . Cancer Mother [id number]    kidney  . Stroke Father   . Hyperlipidemia Father   . Heart disease Father   . COPD Father   . Hypertension Father   . CAD Mother   . Crohn's disease Sister   . Colon cancer Maternal Grandfather   . Lung cancer Other     grandmother  . Crohn's disease Other     nephew  . Colitis Daughter     Social History   Social History  . Marital Status: Divorced    Spouse Name: N/A  . Number of Children: N/A  . Years of Education: college   Occupational History  . Engineer, sitecafeteria  assistant, bus driver    Social History Main Topics  . Smoking status: Never Smoker   . Smokeless tobacco: Never Used  . Alcohol Use: 0.0 oz/week    0 Standard drinks or equivalent per week     Comment: very rare social  . Drug Use: No  . Sexual Activity:    Partners: Male    Birth Control/ Protection: Surgical   Other Topics Concern  . Not on file   Social History Narrative      ROS:  General: Negative for anorexia, weight loss, fever, chills, fatigue, weakness. Eyes: Negative for vision changes.  ENT: Negative for hoarseness, difficulty swallowing , nasal congestion. CV: Negative for chest pain, angina, palpitations, dyspnea on exertion, peripheral edema.  Respiratory: Negative for dyspnea at rest, dyspnea on exertion, cough, sputum, wheezing.  GI: See history of present illness. GU:  Negative for dysuria, hematuria, urinary incontinence, urinary frequency, nocturnal urination.  MS: Negative for joint pain, low back pain.  Derm: Negative for rash or itching.  Neuro: Negative for weakness, abnormal sensation, seizure, frequent headaches, memory loss, confusion.  Psych: Negative for anxiety, depression, suicidal ideation, hallucinations.  Endo: Negative for unusual weight change.  Heme: Negative for bruising or bleeding. Allergy: Negative for rash or hives.    Physical Examination:  BP 128/85 mmHg  Pulse 82  Temp(Src) 97.3 F (36.3 C)  Ht 5\' 4"  (1.626 m)  Wt 214 lb 3.2 oz (97.16 kg)  BMI 36.75 kg/m2   General: Well-nourished, well-developed in no acute distress.  Head: Normocephalic, atraumatic.   Eyes: Conjunctiva pink, no icterus. Mouth: Oropharyngeal mucosa moist and pink , no lesions erythema or exudate. Neck: Supple without thyromegaly, masses, or lymphadenopathy.  Lungs: Clear to auscultation bilaterally.  Heart: Regular rate and rhythm, no murmurs rubs or gallops.  Abdomen: Bowel sounds are normal, moderate epigastric/luq tenderness, "pressure" in  mid-abd, nondistended, no hepatosplenomegaly or masses, no abdominal bruits or    hernia , no rebound or guarding.   Rectal: not performed Extremities: No lower extremity edema. No clubbing or deformities.  Neuro: Alert and oriented x 4 , grossly normal neurologically.  Skin: Warm and dry, no rash or jaundice.   Psych: Alert and cooperative, normal mood and affect.  Labs: Lab Results  Component Value Date   WBC 7.5 12/10/2014   HGB 15.9 12/09/2014   HCT 40.1 12/10/2014   MCV 88.8 08/04/2014   PLT 245 08/04/2014   Lab Results  Component Value Date   CREATININE 0.61 12/10/2014   BUN 17 12/10/2014   NA  140 12/10/2014   K 4.8 12/10/2014   CL 102 12/10/2014   CO2 24 12/10/2014   Lab Results  Component Value Date   ALT 30 12/10/2014   AST 21 12/10/2014   ALKPHOS 67 12/10/2014   BILITOT 0.2 12/10/2014   Lab Results  Component Value Date   LIPASE 47 12/10/2014     Imaging Studies: No results found.  Impression/Plan:  49-year-old female who presents with multiple GI complaints. Recent 2 week history of reported melena, bright red blood per rectum in the setting of worsening abdominal pain/bloating. Denies typical heartburn symptoms. She has a history of peptic ulcer disease. Complains of worsening early satiety, postprandial bloating, nausea with intermittent vomiting. 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 Stools are soft. She does not go daily. Recently felt like her abdomen is bloated/distended more so on the right side.  Given her history of peptic ulcer disease, black stool, blood in the stool we have offered her an upper endoscopy and colonoscopy for further evaluation. Family history significant for IBD and colon cancer as outlined above. Last EGD remote, last colonoscopy 7 years ago. She reports being unable to take GoLYTELY because of vomiting. She will  be given Suprep with 2 days of clear liquids.  I have discussed the risks, alternatives, benefits with regards to but not limited to the risk of reaction to medication, bleeding, infection, perforation and the patient is agreeable to proceed. Written consent to be obtained.  Of note, she has a history of fatty liver, gallbladder remains in situ as well. If EGD is unrevealing, she may need to have further evaluation for biliary disease. Further recommendations to follow.  

## 2015-02-07 ENCOUNTER — Encounter (HOSPITAL_COMMUNITY): Payer: Self-pay | Admitting: Internal Medicine

## 2015-02-07 ENCOUNTER — Telehealth: Payer: Self-pay | Admitting: Internal Medicine

## 2015-02-07 NOTE — Telephone Encounter (Signed)
Tried to call pt- NA- LMOM 

## 2015-02-07 NOTE — Telephone Encounter (Signed)
Spoke with the pt, she said she only had reflux on occasion before and would take pepcid or omeprazole prn. Pt has had bad reflux symptoms(burning and epigastric pain) since Friday and taking dexilant and can not get the symptoms to stop. She is watching her diet. She said the dexilant isnt working at all. She wants to know if there is anything she can do?

## 2015-02-07 NOTE — Telephone Encounter (Signed)
PATIENT HAVING STOMACH PAINS AND DEXILANT IS NOT WORKING   [phone]  PLEASE CALL HER.  THIS HAS BEEN GOING ON SINCE HER PROCEDURE

## 2015-02-08 ENCOUNTER — Encounter: Payer: Self-pay | Admitting: Internal Medicine

## 2015-02-08 NOTE — Telephone Encounter (Signed)
Would add Pepcid complete to Dexilant;  we need to try to get her an appointment between now and next week.

## 2015-02-09 ENCOUNTER — Other Ambulatory Visit: Payer: Self-pay

## 2015-02-09 NOTE — Telephone Encounter (Signed)
PATIENT COMING 8AM 02/10/15 TO SEE ERIC GILL

## 2015-02-09 NOTE — Telephone Encounter (Signed)
Tried to call pt- NA-LMOM with instructions.  Please schedule ov. 

## 2015-02-10 ENCOUNTER — Ambulatory Visit (INDEPENDENT_AMBULATORY_CARE_PROVIDER_SITE_OTHER): Payer: BC Managed Care – PPO | Admitting: Nurse Practitioner

## 2015-02-10 ENCOUNTER — Ambulatory Visit: Payer: BC Managed Care – PPO | Admitting: Gastroenterology

## 2015-02-10 ENCOUNTER — Encounter: Payer: Self-pay | Admitting: Nurse Practitioner

## 2015-02-10 VITALS — BP 127/85 | HR 83 | Temp 97.7°F | Ht 64.0 in | Wt 213.8 lb

## 2015-02-10 DIAGNOSIS — K625 Hemorrhage of anus and rectum: Secondary | ICD-10-CM | POA: Diagnosis not present

## 2015-02-10 DIAGNOSIS — K219 Gastro-esophageal reflux disease without esophagitis: Secondary | ICD-10-CM | POA: Diagnosis not present

## 2015-02-10 MED ORDER — FAMOTIDINE-CA CARB-MAG HYDROX 10-800-165 MG PO CHEW: 1.0000 | CHEWABLE_TABLET | Freq: Every day | ORAL | Status: DC

## 2015-02-10 MED ORDER — SUCRALFATE 1 GM/10ML PO SUSP: 1.0000 g | Freq: Four times a day (QID) | ORAL | Status: DC | PRN

## 2015-02-10 NOTE — Assessment & Plan Note (Signed)
Patient with GERD and dyspepsia status post EGD which found erosive reflux esophagitis despite PPI. She was changed to Dexilant 60 mg once daily. She is left a message to add Pepcid Complete although she states she did not receive this message. At this point we'll add Pepcid Complete to her regimen, continue Dexon. We'll also send in Carafate 1 g 4 times a day as needed for breakthrough symptoms despite Dexilant and Pepcid. However keep her appointment in 4 weeks for follow-up

## 2015-02-10 NOTE — Progress Notes (Signed)
Referring Provider: Mikey Kirschner, MD Primary Care Physician:  Mickie Hillier, MD Primary GI:  Dr. Gala Romney  Chief Complaint  Patient presents with  . Gastroesophageal Reflux    HPI:   49 year old female presents for follow-up on epigastric pain and GERD symptoms. EGD completed 02/04/2015 for GERD and dyspepsia which found erosive reflux esophagitis. Typically, there is circumferential distal esophageal erosions within 5 mm the GE junction, no Barrett's epithelium noted. Recommended to begin Dexalone 60 mg daily. Colonoscopy completed on the same day found grade 2-3 internal hemorrhoids likely source of hematochezia otherwise normal ileocolonoscopy. Recommend trial Linzess 145 mg every other day and deemed likely good hemorrhoid banding candidate should symptoms persist in the future. She called our office 3 days after her procedure stating she is been taking the Dexilant was having worsening reflux symptoms since her procedure. She was recommended to add Pepcid Complete to Dexilant and scheduled for her appointment today.  Today she states her reflux is still worse then pre-procedure. She did not get the message about adding Pepcid. She did take a Pepcid someone gave her which she feels seemed to help. Also tried a teaspoon of mustard (grandparent's home remedy) which helped. Reflux symptoms are constant, is still able to eat. Denies N/V, melena. Is still having occasional toilet tissue hematochezia. Denies chest pain, dyspnea, dizziness, lightheadedness, syncope, near syncope. Denies any other upper or lower GI symptoms.  Past Medical History  Diagnosis Date  . Migraines   . ADD (attention deficit disorder)     Adult  . GERD (gastroesophageal reflux disease)   . IBS (irritable bowel syndrome)   . Hypoestrogenism   . Hypertension   . Bronchitis   . Anxiety   . PONV (postoperative nausea and vomiting)   . Fatty liver     CT 2013    Past Surgical History  Procedure Laterality Date    . Abdominal hysterectomy    . Disc removal 5-6 vert    . Esophagogastroduodenoscopy    . Colonoscopy    . Total abdominal hysterectomy w/ bilateral salpingoophorectomy Left   . Tubal ligation    . Carpal tunnel release Left 09/09/2014    Procedure: LEFT CARPAL TUNNEL RELEASE;  Surgeon: Leanora Cover, MD;  Location: [facility];  Service: Orthopedics;  Laterality: Left;  . Colonoscopy  10/2007    RMR: normal TI, normal colon, internal hemorrhoids  . Colonoscopy with esophagogastroduodenoscopy (egd)  2004    RMR: prepyloric ulcer, negative bx, internal hemorrhoids  . Colonoscopy N/A 02/04/2015    RMR: Grade 2-3 hemorrhoid likely source of hematochezia; otherwise normal ileo-colonoscopy.   . Esophagogastroduodenoscopy N/A 02/04/2015    RMR: Erosive reflux esophagitis    Current Outpatient Prescriptions  Medication Sig Dispense Refill  . albuterol (PROVENTIL HFA;VENTOLIN HFA) 108 (90 BASE) MCG/ACT inhaler Inhale 2 puffs into the lungs every 6 (six) hours as needed for wheezing. 1 Inhaler 2  . ALPRAZolam (XANAX) 0.5 MG tablet TAKE 1 TABLET BY MOUTH ONCE DAILY AS NEEDED FOR ANXIETY. 24 tablet 0  . diazepam (VALIUM) 5 MG tablet Take 2.5-5 mg by mouth as needed for muscle spasms.     Marland Kitchen estradiol (ESTRACE) 2 MG tablet Take 1 tablet (2 mg total) by mouth daily. 30 tablet 11  . Melatonin 1 MG CAPS Take 1-18 mg by mouth at bedtime as needed (sleep).     . neomycin-polymyxin-hydrocortisone (CORTISPORIN) otic solution Place 4 drops into the right ear 4 (four) times daily. 10 mL 2  .  triamterene-hydrochlorothiazide (DYAZIDE) 37.5-25 MG per capsule Take 1 each (1 capsule total) by mouth daily. 30 capsule 5  . Na Sulfate-K Sulfate-Mg Sulf (SUPREP BOWEL PREP) SOLN Take 1 kit by mouth as directed. (Patient not taking: Reported on 02/10/2015) 1 Bottle 0   No current facility-administered medications for this visit.    Allergies as of 02/10/2015 - Review Complete 02/10/2015  Allergen Reaction  Noted  . Phentermine Swelling 10/25/2014  . Codeine Itching   . Morphine Itching   . Other Itching 03/22/2014  . Tramadol Swelling 04/30/2014    Family History  Problem Relation Age of Onset  . Heart disease    . Arthritis    . Lung disease    . Cancer    . Asthma    . Diabetes    . Kidney disease    . Diabetes Mother     Borderline  . Hyperlipidemia Mother   . Stroke Mother   . Heart disease Mother   . COPD Mother   . Cancer Mother 72    kidney  . Stroke Father   . Hyperlipidemia Father   . Heart disease Father   . COPD Father   . Hypertension Father   . CAD Mother   . Crohn's disease Sister   . Colon cancer Maternal Grandfather   . Lung cancer Other     grandmother  . Crohn's disease Other     nephew  . Colitis Daughter     Social History   Social History  . Marital Status: Divorced    Spouse Name: N/A  . Number of Children: N/A  . Years of Education: college   Occupational History  . Armed forces technical officer, bus driver    Social History Main Topics  . Smoking status: Never Smoker   . Smokeless tobacco: Never Used  . Alcohol Use: 0.0 oz/week    0 Standard drinks or equivalent per week     Comment: very rare social  . Drug Use: No  . Sexual Activity:    Partners: Male    Birth Control/ Protection: Surgical   Other Topics Concern  . None   Social History Narrative    Review of Systems: General: Negative for anorexia, weight loss, fever, chills, fatigue, weakness. Eyes: Negative for vision changes.  CV: Negative for chest pain, angina, palpitations, peripheral edema.  Respiratory: Negative for dyspnea at rest, cough, sputum, wheezing.  GI: See history of present illness. Endo: Negative for unusual weight change.    Physical Exam: BP 127/85 mmHg  Pulse 83  Temp(Src) 97.7 F (36.5 C)  Ht 5' 4"  (1.626 m)  Wt 213 lb 12.8 oz (96.979 kg)  BMI 36.68 kg/m2 General:   Alert and oriented. Pleasant and cooperative. Well-nourished and  well-developed.  Head:  Normocephalic and atraumatic. Eyes:  Without icterus, sclera clear and conjunctiva pink.  Ears:  Normal auditory acuity. Cardiovascular:  S1, S2 present without murmurs appreciated. Extremities without clubbing or edema. Respiratory:  Clear to auscultation bilaterally. No wheezes, rales, or rhonchi. No distress.  Gastrointestinal:  +BS, soft, and non-distended. Mild abdominal TTP. No HSM noted. No guarding or rebound. No masses appreciated.  Rectal:  Deferred  Neurologic:  Alert and oriented x4;  grossly normal neurologically. Psych:  Alert and cooperative. Normal mood and affect. Heme/Lymph/Immune: No excessive bruising noted.    02/10/2015 8:17 AM

## 2015-02-10 NOTE — Patient Instructions (Signed)
1. We will add Pepcid Complete dear Dexilant, take it every day. 2. I will also send an Carafate your pharmacy that she can take 4 times a day as needed for particularly bad symptoms. 3. Keep your point with Dr. Jena Gaussourk in 1 month.

## 2015-02-10 NOTE — Assessment & Plan Note (Signed)
He can use to have occasional toilet tissue hematochezia likely due to internal hemorrhoids as noted on colonoscopy. For symptoms persist or not relieved by rectal topical applications can consider her for hemorrhoid banding in office. Continue to monitor, keep appointment in 1 month as scheduled for further evaluation and management.

## 2015-02-11 NOTE — Progress Notes (Signed)
CC'D TO PCP °

## 2015-02-23 ENCOUNTER — Other Ambulatory Visit (HOSPITAL_COMMUNITY): Payer: Self-pay | Admitting: Neurosurgery

## 2015-02-23 DIAGNOSIS — M546 Pain in thoracic spine: Secondary | ICD-10-CM

## 2015-03-02 ENCOUNTER — Ambulatory Visit (HOSPITAL_COMMUNITY)
Admission: RE | Admit: 2015-03-02 | Discharge: 2015-03-02 | Disposition: A | Discharge status: Home / Self Care | Payer: BC Managed Care – PPO | Source: Ambulatory Visit | Attending: Neurosurgery | Admitting: Neurosurgery

## 2015-03-02 DIAGNOSIS — M546 Pain in thoracic spine: Secondary | ICD-10-CM | POA: Insufficient documentation

## 2015-03-02 DIAGNOSIS — Z981 Arthrodesis status: Secondary | ICD-10-CM | POA: Diagnosis not present

## 2015-03-10 ENCOUNTER — Other Ambulatory Visit (HOSPITAL_COMMUNITY): Payer: BC Managed Care – PPO

## 2015-03-10 ENCOUNTER — Ambulatory Visit: Payer: BC Managed Care – PPO | Admitting: Nutrition

## 2015-03-11 ENCOUNTER — Encounter: Payer: BC Managed Care – PPO | Admitting: Internal Medicine

## 2015-03-15 ENCOUNTER — Encounter: Payer: BC Managed Care – PPO | Admitting: Internal Medicine

## 2015-04-01 ENCOUNTER — Encounter: Payer: Self-pay | Admitting: Family Medicine

## 2015-04-01 ENCOUNTER — Ambulatory Visit (INDEPENDENT_AMBULATORY_CARE_PROVIDER_SITE_OTHER): Payer: BC Managed Care – PPO | Admitting: Family Medicine

## 2015-04-01 VITALS — BP 126/84 | Temp 99.1°F | Ht 64.0 in | Wt 217.5 lb

## 2015-04-01 DIAGNOSIS — R05 Cough: Secondary | ICD-10-CM

## 2015-04-01 DIAGNOSIS — J111 Influenza due to unidentified influenza virus with other respiratory manifestations: Secondary | ICD-10-CM

## 2015-04-01 DIAGNOSIS — R059 Cough, unspecified: Secondary | ICD-10-CM

## 2015-04-01 MED ORDER — OSELTAMIVIR PHOSPHATE 75 MG PO CAPS: ORAL_CAPSULE | ORAL | Status: DC

## 2015-04-01 MED ORDER — METHYLPREDNISOLONE ACETATE 40 MG/ML IJ SUSP
40.0000 mg | Freq: Once | INTRAMUSCULAR | Status: AC
  Administered 2015-04-01: 40 mg via INTRAMUSCULAR

## 2015-04-01 MED ORDER — HYDROCODONE-HOMATROPINE 5-1.5 MG/5ML PO SYRP: ORAL_SOLUTION | ORAL | Status: DC

## 2015-04-01 NOTE — Progress Notes (Signed)
   Subjective:    Patient ID: Mackenzie Gray, female    DOB: [birth year], 50 y.o.   MRN: [id number]  Cough This is a new problem. The current episode started in the past 7 days. The problem has been gradually worsening. The problem occurs every few minutes. The cough is productive of sputum. Associated symptoms include ear pain, headaches, myalgias, rhinorrhea and wheezing. Associated symptoms comments: Diarrhea, Abdominal Pain. Treatments tried: Mucinex, Aleve.  kicked in yest, headaceh diffuse, dim energy  achey all over, dim appetite and not feeling well   Patient has c/o of ears itching,and hurting.  Cough prod at times, low grade fever and achey  Review of Systems  HENT: Positive for ear pain and rhinorrhea.   Respiratory: Positive for cough and wheezing.   Musculoskeletal: Positive for myalgias.  Neurological: Positive for headaches.       Objective:   Physical Exam  Alert substantial malaise. Hydration decent vitals stable intermittent bad cough during exam HET moderate his congestion frontal neck supple lungs wheezy cough heart regular in rhythm.      Assessment & Plan:  Impression flu with exacerbation of reactive airways plan Depo-Medrol injection. Tamiflu twice a day 5 days. Symptom care discussed cough medicine prescribed WSL

## 2015-04-04 ENCOUNTER — Encounter: Payer: Self-pay | Admitting: Family Medicine

## 2015-04-18 ENCOUNTER — Telehealth: Payer: Self-pay | Admitting: Family Medicine

## 2015-04-18 MED ORDER — AMOXICILLIN-POT CLAVULANATE 875-125 MG PO TABS: 1.0000 | ORAL_TABLET | Freq: Two times a day (BID) | ORAL | Status: AC

## 2015-04-18 NOTE — Telephone Encounter (Signed)
Patient wanting something called in for sinus infection now was seen 1/27 with the flu. Call into [facility] she wants it delivered.

## 2015-04-18 NOTE — Telephone Encounter (Signed)
Patient has nasal congestion, facial pain and pressure, headaches, slight cough. No other symptoms. Started 2-3 days ago

## 2015-04-18 NOTE — Telephone Encounter (Signed)
Med sent pharmacy. Patient was notified.

## 2015-04-18 NOTE — Telephone Encounter (Signed)
Aug 875 bid ten d 

## 2015-05-23 ENCOUNTER — Telehealth: Payer: Self-pay | Admitting: Family Medicine

## 2015-05-23 NOTE — Telephone Encounter (Signed)
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 Form is in yellow folder at nurse station. Pt is also requesting some ear drops to be called in for her ears. Pt states that she is unable to get rid of the ear infection.

## 2015-05-23 NOTE — Telephone Encounter (Signed)
Spoke with patient to discuss ear pain symptoms. Patient states that ear pain started as a itching sensation that turn into pain to bilateral ears. Patient is also experiencing yellow discharge. Denies fever. Please advise?

## 2015-05-23 NOTE — Telephone Encounter (Signed)
Ov reec

## 2015-05-23 NOTE — Telephone Encounter (Signed)
Left message to return call 

## 2015-06-06 ENCOUNTER — Encounter: Payer: Self-pay | Admitting: Family Medicine

## 2015-06-06 ENCOUNTER — Ambulatory Visit (INDEPENDENT_AMBULATORY_CARE_PROVIDER_SITE_OTHER): Payer: BC Managed Care – PPO | Admitting: Nurse Practitioner

## 2015-06-06 ENCOUNTER — Encounter: Payer: Self-pay | Admitting: Nurse Practitioner

## 2015-06-06 VITALS — BP 150/88 | Temp 98.4°F | Ht 64.0 in | Wt 218.0 lb

## 2015-06-06 DIAGNOSIS — H60391 Other infective otitis externa, right ear: Secondary | ICD-10-CM

## 2015-06-06 DIAGNOSIS — J300 Vasomotor rhinitis: Secondary | ICD-10-CM | POA: Diagnosis not present

## 2015-06-06 MED ORDER — TRIAMCINOLONE ACETONIDE 0.1 % EX CREA: 1.0000 "application " | TOPICAL_CREAM | Freq: Two times a day (BID) | CUTANEOUS | Status: DC

## 2015-06-06 NOTE — Progress Notes (Signed)
Subjective:  Presents for complaints of itching and slight burning in the right ear canal for the past several days. No drainage. Minimal pain. No fever. Also mild head congestion. Minimal cough. No sore throat. Also patient has her DMV form today, will need a letter regarding her mental health status. According to the patient she was experiencing significant depression years ago when she was in a "bad marriage". In the old paper records, bipolar disorder is listed in her problem list that there is no documentation in the chart from a provider that could be found. Patient denies any previous diagnosis of bipolar disorder. Has not been on any medication other than a rare Xanax, the last prescription was written months ago. States that now she is in a good relationship over the past 10 years has not needed any medication. Has had some trouble with weight gain. Did not take her blood pressure pill this morning.   Objective:   BP 150/88 mmHg  Temp(Src) 98.4 F (36.9 C) (Oral)  Ht 5\' 4"  (1.626 m)  Wt 218 lb (98.884 kg)  BMI 37.40 kg/m2 NAD. Alert, oriented. Calm affect. Thoughts logical coherent and relevant. Lungs clear. Heart regular rate rhythm. TMs mild clear effusion, no erythema. The outer half of the right TM has a very mild area of edema and mild erythema. No drainage noted. Pharynx clear. Neck supple with minimal adenopathy.  Assessment: Otitis, externa, infective, right  Vasomotor rhinitis  Plan: OTC meds as directed for congestion. Meds ordered this encounter  Medications  . triamcinolone cream (KENALOG) 0.1 %    Sig: Apply 1 application topically 2 (two) times daily. Prn rash; use up to 2 weeks    Dispense:  30 g    Refill:  0    Please deliver    Order Specific Question:  Supervising Provider    Answer:  Merlyn AlbertLUKING, WILLIAM S [[id number]]   Place a small amount of triamcinolone cream using a cotton tip applicator gently  just inside the right ear canal twice a day when necessary. Call back  if persists. A letter will be done for DMV. Strongly recommend preventive health physical.

## 2015-06-08 ENCOUNTER — Encounter: Payer: Self-pay | Admitting: Nurse Practitioner

## 2015-07-18 ENCOUNTER — Ambulatory Visit (HOSPITAL_COMMUNITY)
Admission: RE | Admit: 2015-07-18 | Discharge: 2015-07-18 | Disposition: A | Discharge status: Home / Self Care | Payer: BC Managed Care – PPO | Source: Ambulatory Visit | Attending: Nurse Practitioner | Admitting: Nurse Practitioner

## 2015-07-18 ENCOUNTER — Ambulatory Visit (INDEPENDENT_AMBULATORY_CARE_PROVIDER_SITE_OTHER): Payer: BC Managed Care – PPO | Admitting: Nurse Practitioner

## 2015-07-18 ENCOUNTER — Encounter: Payer: Self-pay | Admitting: Nurse Practitioner

## 2015-07-18 ENCOUNTER — Other Ambulatory Visit: Payer: Self-pay | Admitting: Nurse Practitioner

## 2015-07-18 ENCOUNTER — Ambulatory Visit (HOSPITAL_COMMUNITY): Admission: RE | Admit: 2015-07-18 | Payer: BC Managed Care – PPO | Source: Ambulatory Visit

## 2015-07-18 VITALS — BP 130/82 | Ht 64.0 in | Wt 221.0 lb

## 2015-07-18 DIAGNOSIS — Z01419 Encounter for gynecological examination (general) (routine) without abnormal findings: Secondary | ICD-10-CM

## 2015-07-18 DIAGNOSIS — Z1231 Encounter for screening mammogram for malignant neoplasm of breast: Secondary | ICD-10-CM

## 2015-07-18 DIAGNOSIS — R5383 Other fatigue: Secondary | ICD-10-CM

## 2015-07-18 DIAGNOSIS — R3915 Urgency of urination: Secondary | ICD-10-CM | POA: Diagnosis not present

## 2015-07-18 DIAGNOSIS — Z0000 Encounter for general adult medical examination without abnormal findings: Secondary | ICD-10-CM | POA: Diagnosis not present

## 2015-07-18 LAB — POCT URINALYSIS DIPSTICK
Blood, UA: NEGATIVE
LEUKOCYTES UA: NEGATIVE
PH UA: 5
Spec Grav, UA: 1.02

## 2015-07-18 NOTE — Progress Notes (Signed)
Subjective:    Patient ID: Mackenzie Gray, female    DOB: 26-Feb-1966, 50 y.o.   MRN: [id number]  HPI presents For her wellness exam. Same sexual partner. Regular eye exams. No regular dental care. Regular activity including walking and riding an exercise bike. Still concerned about weight. Mild urinary urgency with slight odor to her urine. No other urinary symptoms. Continues follow-up with her specialist regarding her back and her hands.    Review of Systems  Constitutional: Positive for fatigue. Negative for fever, activity change and appetite change.  HENT: Positive for dental problem. Negative for ear pain, sinus pressure and sore throat.   Respiratory: Negative for cough, chest tightness, shortness of breath and wheezing.   Cardiovascular: Negative for chest pain.  Gastrointestinal: Positive for blood in stool. Negative for nausea, vomiting, abdominal pain, diarrhea, constipation and abdominal distention.       Has a long-term history of off-and-on bright red bleeding with stools due to hemorrhoids, colonoscopy is up-to-date.  Genitourinary: Positive for urgency. Negative for dysuria, frequency, vaginal discharge, enuresis, difficulty urinating, genital sores and pelvic pain.       Objective:   Physical Exam  Constitutional: She is oriented to person, place, and time. She appears well-developed. No distress.  HENT:  Right Ear: External ear normal.  Left Ear: External ear normal.  Mouth/Throat: Oropharynx is clear and moist.  Neck: Normal range of motion. Neck supple. No tracheal deviation present. No thyromegaly present.  Cardiovascular: Normal rate, regular rhythm and normal heart sounds.  Exam reveals no gallop.   No murmur heard. Pulmonary/Chest: Effort normal and breath sounds normal.  Abdominal: Soft. She exhibits no distension and no mass. There is tenderness. There is no rebound and no guarding.  Mild left lower quadrant tenderness on exam.  Genitourinary: Vagina normal.  No vaginal discharge found.  External GU no rashes or lesions. Vagina slightly pale, clear discharge. Bimanual exam no masses or tenderness.  Musculoskeletal: She exhibits no edema.  Lymphadenopathy:    She has no cervical adenopathy.  Neurological: She is alert and oriented to person, place, and time.  Skin: Skin is warm and dry. No rash noted.  Psychiatric: She has a normal mood and affect. Her behavior is normal.  Vitals reviewed. Breast exam: Areas of dense tissue. Minimal fine nodularity. Axilla no adenopathy. Results for orders placed or performed in visit on 07/18/15  POCT urinalysis dipstick  Result Value Ref Range   Color, UA Yellow    Clarity, UA Cloudy    Glucose, UA     Bilirubin, UA     Ketones, UA     Spec Grav, UA 1.020    Blood, UA Negative    pH, UA 5.0    Protein, UA     Urobilinogen, UA     Nitrite, UA     Leukocytes, UA Negative Negative   s       Assessment & Plan:   Problem List Items Addressed This Visit      Other   Morbid obesity (HCC)    Other Visit Diagnoses    Well woman exam    -  Primary    Relevant Orders    CBC with Differential/Platelet    Lipid panel    Basic metabolic panel    Hepatic function panel    VITAMIN D 25 Hydroxy (Vit-D Deficiency, Fractures)    MM DIGITAL SCREENING BILATERAL    Urinary urgency        Relevant Orders  POCT urinalysis dipstick (Completed)    Other fatigue        Relevant Orders    CBC with Differential/Platelet    TSH    Visit for screening mammogram        Relevant Orders    MM DIGITAL SCREENING BILATERAL      Encouraged healthy diet and continued activity. Daily vitamin D and calcium.  Discussed bariatric surgery which patient is considering.  Return in about 1 year (around 07/17/2016) for physical.

## 2015-07-19 ENCOUNTER — Encounter: Payer: Self-pay | Admitting: Nurse Practitioner

## 2015-07-19 LAB — CBC WITH DIFFERENTIAL/PLATELET
BASOS ABS: 0.1 10*3/uL (ref 0.0–0.2)
Basos: 1 %
EOS (ABSOLUTE): 0.2 10*3/uL (ref 0.0–0.4)
Eos: 4 %
HEMATOCRIT: 41.6 % (ref 34.0–46.6)
HEMOGLOBIN: 14.1 g/dL (ref 11.1–15.9)
IMMATURE GRANS (ABS): 0 10*3/uL (ref 0.0–0.1)
IMMATURE GRANULOCYTES: 0 %
LYMPHS ABS: 1.9 10*3/uL (ref 0.7–3.1)
Lymphs: 37 %
MCH: 30.1 pg (ref 26.6–33.0)
MCHC: 33.9 g/dL (ref 31.5–35.7)
MCV: 89 fL (ref 79–97)
MONOCYTES: 7 %
MONOS ABS: 0.4 10*3/uL (ref 0.1–0.9)
Neutrophils Absolute: 2.7 10*3/uL (ref 1.4–7.0)
Neutrophils: 51 %
Platelets: 260 10*3/uL (ref 150–379)
RBC: 4.69 x10E6/uL (ref 3.77–5.28)
RDW: 13.1 % (ref 12.3–15.4)
WBC: 5.2 10*3/uL (ref 3.4–10.8)

## 2015-07-19 LAB — HEPATIC FUNCTION PANEL
ALT: 27 IU/L (ref 0–32)
AST: 19 IU/L (ref 0–40)
Albumin: 4.2 g/dL (ref 3.5–5.5)
Alkaline Phosphatase: 71 IU/L (ref 39–117)
BILIRUBIN TOTAL: 0.4 mg/dL (ref 0.0–1.2)
BILIRUBIN, DIRECT: 0.09 mg/dL (ref 0.00–0.40)
Total Protein: 6.9 g/dL (ref 6.0–8.5)

## 2015-07-19 LAB — BASIC METABOLIC PANEL
BUN / CREAT RATIO: 17 (ref 9–23)
BUN: 12 mg/dL (ref 6–24)
CHLORIDE: 103 mmol/L (ref 96–106)
CO2: 23 mmol/L (ref 18–29)
Calcium: 9.2 mg/dL (ref 8.7–10.2)
Creatinine, Ser: 0.72 mg/dL (ref 0.57–1.00)
GFR calc non Af Amer: 99 mL/min/{1.73_m2} (ref >59)
GFR, EST AFRICAN AMERICAN: 114 mL/min/{1.73_m2} (ref >59)
Glucose: 93 mg/dL (ref 65–99)
Potassium: 4.9 mmol/L (ref 3.5–5.2)
Sodium: 140 mmol/L (ref 134–144)

## 2015-07-19 LAB — LIPID PANEL
CHOL/HDL RATIO: 3.2 ratio (ref 0.0–4.4)
Cholesterol, Total: 183 mg/dL (ref 100–199)
HDL: 57 mg/dL (ref >39)
LDL Calculated: 97 mg/dL (ref 0–99)
TRIGLYCERIDES: 143 mg/dL (ref 0–149)
VLDL Cholesterol Cal: 29 mg/dL (ref 5–40)

## 2015-07-19 LAB — VITAMIN D 25 HYDROXY (VIT D DEFICIENCY, FRACTURES): Vit D, 25-Hydroxy: 27.2 ng/mL — ABNORMAL LOW (ref 30.0–100.0)

## 2015-07-19 LAB — TSH: TSH: 2.11 u[IU]/mL (ref 0.450–4.500)

## 2015-08-10 ENCOUNTER — Ambulatory Visit (HOSPITAL_COMMUNITY): Payer: BC Managed Care – PPO

## 2015-10-05 ENCOUNTER — Ambulatory Visit (INDEPENDENT_AMBULATORY_CARE_PROVIDER_SITE_OTHER): Payer: BC Managed Care – PPO | Admitting: Family Medicine

## 2015-10-05 ENCOUNTER — Encounter: Payer: Self-pay | Admitting: Family Medicine

## 2015-10-05 VITALS — BP 124/80 | Temp 98.5°F | Ht 64.0 in | Wt 219.0 lb

## 2015-10-05 DIAGNOSIS — R21 Rash and other nonspecific skin eruption: Secondary | ICD-10-CM | POA: Diagnosis not present

## 2015-10-05 MED ORDER — DOXYCYCLINE HYCLATE 100 MG PO TABS: 100.0000 mg | ORAL_TABLET | Freq: Two times a day (BID) | ORAL | 0 refills | Status: DC

## 2015-10-05 MED ORDER — TRIAMCINOLONE ACETONIDE 0.1 % EX CREA: 1.0000 "application " | TOPICAL_CREAM | Freq: Two times a day (BID) | CUTANEOUS | 1 refills | Status: DC

## 2015-10-05 NOTE — Progress Notes (Signed)
   Subjective:    Patient ID: Mackenzie Gray, female    DOB: 10/05/1965, 50 y.o.   MRN: [id number]  Rash  This is a new problem. The current episode started more than 1 month ago. Location: both arms and left ankle. The rash is characterized by blistering and itchiness. Treatments tried: hydrocortisone cream, tea tree oil, benadryl, lotions.   Shampoo new did nt work   Patient presents with rather diffuse symptomatology. Small sores crop up various areas of the body. Sometimes apparently after insect bite sometimes on the Rhne. Painful. Also anterior arm itching left arm greater than right and in diffuse patch around the elbow. No obvious rash. Next  Also swollen lymph node right posterior cervical chain. This occurred a couple weeks after the last scalp lesion occur. Next  Patient very much bothered by all the symptomatology and has tried many over-the-counter agents without success and has been dealing with it for a year  + Review of Systems  Skin: Positive for rash.       Objective:   Physical Exam  Alert vital stable lungs clear heart rare rhythm small discrete sores tender nature scalp arms legs. Positive reactive lymphadenitis right posterior cervical chain. Pruritic area no obvious changes on scan      Assessment & Plan:  Impression 1 recurrent skin sores with secondary lymphadenitis and diffuse pruritus with duration and major impact on patient recommend both intervention and referral rationale discussed

## 2015-11-14 ENCOUNTER — Telehealth: Payer: Self-pay | Admitting: Family Medicine

## 2015-11-14 DIAGNOSIS — M79602 Pain in left arm: Principal | ICD-10-CM

## 2015-11-14 DIAGNOSIS — M79601 Pain in right arm: Secondary | ICD-10-CM

## 2015-11-14 NOTE — Telephone Encounter (Signed)
Patient is having trouble with her arm and hand again and Dr. Merlyn LotKuzma is requiring another referral. Please advise.  She has an appointment for 11/16/15 at 2:45pm.

## 2015-11-16 NOTE — Telephone Encounter (Signed)
That's a gotcha, pt scheduling urgent visit and or calling us at the last minute, plz do but likely wont be able to do today

## 2015-11-16 NOTE — Telephone Encounter (Signed)
Referral in system

## 2015-11-17 ENCOUNTER — Telehealth: Payer: Self-pay | Admitting: Family Medicine

## 2015-11-17 ENCOUNTER — Other Ambulatory Visit: Payer: Self-pay | Admitting: Nurse Practitioner

## 2015-11-17 DIAGNOSIS — G5692 Unspecified mononeuropathy of left upper limb: Secondary | ICD-10-CM

## 2015-11-17 DIAGNOSIS — M79601 Pain in right arm: Secondary | ICD-10-CM

## 2015-11-17 DIAGNOSIS — M79602 Pain in left arm: Principal | ICD-10-CM

## 2015-11-17 NOTE — Telephone Encounter (Signed)
Pt called requesting referral to [facility]   Left hand pain & numbness, Dr. Betha LoaKevin Kuzma (hand ortho) did x-rays at visit yesterday and said everything he did looked good & recommended patient get back in with her neurosurgeon  Pt states had feels tight, like it's being pinched  Needs referral due to her Medicaid coverage (12/02/15 she's off work)    Please advise

## 2015-11-23 ENCOUNTER — Encounter: Payer: Self-pay | Admitting: Family Medicine

## 2015-12-15 ENCOUNTER — Encounter: Payer: Self-pay | Admitting: Family Medicine

## 2015-12-15 ENCOUNTER — Ambulatory Visit (INDEPENDENT_AMBULATORY_CARE_PROVIDER_SITE_OTHER): Payer: BC Managed Care – PPO | Admitting: Family Medicine

## 2015-12-15 VITALS — BP 118/80 | Temp 98.5°F | Ht 64.0 in | Wt 215.2 lb

## 2015-12-15 DIAGNOSIS — G542 Cervical root disorders, not elsewhere classified: Secondary | ICD-10-CM | POA: Diagnosis not present

## 2015-12-15 DIAGNOSIS — R21 Rash and other nonspecific skin eruption: Secondary | ICD-10-CM | POA: Diagnosis not present

## 2015-12-15 MED ORDER — DOXYCYCLINE HYCLATE 100 MG PO TABS: 100.0000 mg | ORAL_TABLET | Freq: Two times a day (BID) | ORAL | 0 refills | Status: DC

## 2015-12-15 NOTE — Progress Notes (Signed)
   Subjective:    Patient ID: Mackenzie Gray, female    DOB: June 16, 1965, 50 y.o.   MRN: [id number]  Otalgia   There is pain in both ears. This is a new problem. The current episode started 1 to 4 weeks ago. Associated symptoms include a rash. Treatments tried: Summer's ear, perioxide water.  Patient also relates intermittent rash on the arms that concerns her she supposed to see a dermatologist at some point. She states she never heard regarding an appointment. 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 Lovell SheehanJenkins    Review of Systems  HENT: Positive for ear pain.   Skin: Positive for rash.  Arm pain weakness into the arm numbness into the arms complains of neck pain also complains of intermittent rash     Objective:   Physical Exam Erythematous splotches on the arms with some impetigo possibly where the patient's been scratching at it lungs are clear hearts regular she has weakness in both arms 4 out of 5 hard to judge totally subjected discomfort into both arms with some numbness into the hands.       Assessment & Plan:  I recommend the patient have a MRI of the neck. Due to the neck pain and bilateral arm weakness and numbness along with abnormal nerve conduction EMG patient has had previous neck surgery  Referral paced states she Arty has an appointment in January with specialist may need to move it up  Rash on the arms-we will have our staff make referral again. If  she has ongoing trouble she need to see dermatology doxycycline twice a day triamcinolone when necessary  The patient states she will get information from Dr. Merlyn LotKuzma sent here she signed a release we await the results of this this may aid us in knowing what else we need to do for the patient

## 2015-12-16 ENCOUNTER — Encounter: Payer: Self-pay | Admitting: Family Medicine

## 2015-12-23 ENCOUNTER — Ambulatory Visit (HOSPITAL_COMMUNITY): Payer: BC Managed Care – PPO

## 2015-12-23 ENCOUNTER — Telehealth: Payer: Self-pay | Admitting: Family Medicine

## 2015-12-23 NOTE — Telephone Encounter (Signed)
Pt dropped off her laboratory report from [facility]. See. Blue folder in office.

## 2015-12-23 NOTE — Telephone Encounter (Signed)
Please give feedback regarding what you believe the reason was MRI was rejected

## 2015-12-23 NOTE — Telephone Encounter (Signed)
Pt's C-spine MRI was denied by her insurance  [facility]  Next step?  Please advise

## 2015-12-27 NOTE — Telephone Encounter (Signed)
Denial was most likely due to lack of clinical information such as severity of symptoms and meds or PT to treat  Pt states that her neurosurgeon can see her in January but could possibly get her in sooner with a new MRI if it showed worsening condition

## 2015-12-27 NOTE — Telephone Encounter (Signed)
Please inform the patient that MRI was denied. This is very common with insurance companies. The patient has a couple options option #1 is to see her specialists in January and then they can order the MRI often they are more successful because they are considered specialist option #2 if she has the report from her EMG nerve conduction study from her hand specialist sent here and she schedule a follow-up office visit with Dr. Brett CanalesSteve somewhere in the coming weeks to discuss her neck and arms specifically then weekend try to get the MRI approved based on the findings of the hand specialists as well as follow-up visit Dr. Brett CanalesSteve

## 2015-12-28 NOTE — Telephone Encounter (Signed)
Left message to return call 

## 2015-12-29 NOTE — Telephone Encounter (Signed)
Advised patient Please inform the patient that MRI was denied. This is very common with insurance companies. The patient has a couple options option #1 is to see her specialists in January and then they can order the MRI often they are more successful because they are considered specialist option #2 if she has the report from her EMG nerve conduction study from her hand specialist sent here and she schedule a follow-up office visit with Dr. Brett CanalesSteve somewhere in the coming weeks to discuss her neck and arms specifically then weekend try to get the MRI approved based on the findings of the hand specialists as well as follow-up visit Dr. Brett CanalesSteve. Patient verbalized understanding and will schedule follow up office visit to discuss.

## 2016-01-06 ENCOUNTER — Telehealth: Payer: Self-pay | Admitting: Family Medicine

## 2016-01-06 NOTE — Telephone Encounter (Signed)
Patient said Dr. Brett CanalesSteve told her to call back if she continued to have difficulty with her back and hands.  she said that all this week, her hands are throbbing and arms cramping up.  Her back and left leg are cramping up also.  Please advise.

## 2016-01-08 NOTE — Telephone Encounter (Signed)
See dr scotts last phone mess, rec pt to wait til jan or f u with me face to face again after we get [facility] report, we got it, , rec o v with me

## 2016-01-09 NOTE — Telephone Encounter (Signed)
Left message on voicemail to return call.

## 2016-01-09 NOTE — Telephone Encounter (Signed)
Transferred to front desk to schedule appointment.  

## 2016-01-24 ENCOUNTER — Encounter: Payer: Self-pay | Admitting: Family Medicine

## 2016-01-24 ENCOUNTER — Ambulatory Visit (INDEPENDENT_AMBULATORY_CARE_PROVIDER_SITE_OTHER): Payer: BC Managed Care – PPO | Admitting: Family Medicine

## 2016-01-24 VITALS — BP 130/92 | Ht 64.0 in | Wt 213.0 lb

## 2016-01-24 DIAGNOSIS — G542 Cervical root disorders, not elsewhere classified: Secondary | ICD-10-CM

## 2016-01-24 DIAGNOSIS — M542 Cervicalgia: Secondary | ICD-10-CM | POA: Diagnosis not present

## 2016-01-24 DIAGNOSIS — Z23 Encounter for immunization: Secondary | ICD-10-CM | POA: Diagnosis not present

## 2016-01-24 MED ORDER — TRIAMCINOLONE ACETONIDE 0.1 % EX CREA: 1.0000 "application " | TOPICAL_CREAM | Freq: Two times a day (BID) | CUTANEOUS | 0 refills | Status: DC

## 2016-01-24 MED ORDER — AMOXICILLIN 500 MG PO CAPS: 500.0000 mg | ORAL_CAPSULE | Freq: Three times a day (TID) | ORAL | 0 refills | Status: DC

## 2016-01-24 NOTE — Progress Notes (Signed)
   Subjective:    Patient ID: Mackenzie Gray, female    DOB: 1965-05-24, 50 y.o.   MRN: [id number]  Back Pain  This is a new problem. Episode onset: august 2017.   Bilateral hand and arm pain and numbness. Left hand worse.  Left hand hurts constantly, super sensitive. Had surg of the left hand, and it t helped  CTS surgery July of 2016  Neck fusion April 7th, with dr Lovell Sheehanjenkins.  Had addtnl ncs two mo ago, showed no evid currently of cts, and now a cervical neuropathy .  Revealed c7 c8 neuropthy   Swelling and itching in left wrist for the past couple of days.   Low grade fever off and on for  2 days.   Review of Systems  Musculoskeletal: Positive for back pain.       Objective:   Physical Exam Alert slightly anxious appearing blood pressure initially elevated some improvement on repeat neck supple although pain with lateral rotation both directions, lungs clear heart rare rhythm left hand diminished sensation thumb and forefinger left hand grip question diminished       Assessment & Plan:  Impression history of carpal tunnel syndrome left arm with subsequent surgery and now recurrence of symptoms. Nerve conduction studies show C7 C8 involvement at the cervical level. Patient has known history of cervical disc herniation and subsequent surgery. Noting similar pain from her neck and shoulder into her arm and hand now. 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 Insurance company regretfully has been reluctant to cover an MRI. Now that she has nerve conduction studies revealing cervical level neuropathy hopefully they will do the right thing and cover this now. Will reattempt to order 25 minutes spent most in discussion

## 2016-01-31 ENCOUNTER — Telehealth: Payer: Self-pay | Admitting: Family Medicine

## 2016-01-31 NOTE — Telephone Encounter (Signed)
Patient's MRI of the cervical spine w/o contrast was Authorized via aim for 01/30/16-02/28/2016. Order number is [id number]

## 2016-02-02 ENCOUNTER — Ambulatory Visit (HOSPITAL_COMMUNITY)
Admission: RE | Admit: 2016-02-02 | Discharge: 2016-02-02 | Disposition: A | Discharge status: Home / Self Care | Payer: BC Managed Care – PPO | Source: Ambulatory Visit | Attending: Family Medicine | Admitting: Family Medicine

## 2016-02-02 DIAGNOSIS — M542 Cervicalgia: Secondary | ICD-10-CM | POA: Diagnosis present

## 2016-02-02 DIAGNOSIS — Z9889 Other specified postprocedural states: Secondary | ICD-10-CM | POA: Insufficient documentation

## 2016-02-02 DIAGNOSIS — M4802 Spinal stenosis, cervical region: Secondary | ICD-10-CM | POA: Diagnosis not present

## 2016-02-02 DIAGNOSIS — M50221 Other cervical disc displacement at C4-C5 level: Secondary | ICD-10-CM | POA: Diagnosis not present

## 2016-02-28 ENCOUNTER — Other Ambulatory Visit: Payer: Self-pay | Admitting: Family Medicine

## 2016-02-28 ENCOUNTER — Telehealth: Payer: Self-pay | Admitting: Family Medicine

## 2016-02-28 ENCOUNTER — Other Ambulatory Visit: Payer: Self-pay | Admitting: *Deleted

## 2016-02-28 MED ORDER — AMOXICILLIN-POT CLAVULANATE 875-125 MG PO TABS: 1.0000 | ORAL_TABLET | Freq: Two times a day (BID) | ORAL | 0 refills | Status: DC

## 2016-02-28 MED ORDER — NEOMYCIN-POLYMYXIN-HC 3.5-10000-1 OT SOLN: 4.0000 [drp] | Freq: Four times a day (QID) | OTIC | 0 refills | Status: DC

## 2016-02-28 NOTE — Telephone Encounter (Signed)
Yes refill, plus oral aug 875 bid ten d

## 2016-02-28 NOTE — Telephone Encounter (Signed)
Discussed with pt.augmentin and cortisoporin drops sent to pharm.

## 2016-02-28 NOTE — Telephone Encounter (Signed)
Cortisporin otic drops prescribed nov 2016 expired 01/2016. Pt wants a refill. Having ear pain, slight fever last night and pain starting to go into her jaw.

## 2016-02-28 NOTE — Telephone Encounter (Signed)
Pt called stating her ear is starting to get infected again and has ran out of the ear drops she was given last month. Pt is wanting to know if she can get a refill on them.   [facility]

## 2016-03-27 ENCOUNTER — Telehealth: Payer: Self-pay | Admitting: Family Medicine

## 2016-03-27 NOTE — Telephone Encounter (Signed)
We referred patient to Dr. Lovell SheehanJenkins for her back issue.  Dr. Lovell SheehanJenkins told her that there is nothing wrong with her back and he found nothing wrong with her spine.  She said her hands are throbbing and she needs to know what to do.  I offered her an opening with Dr. Brett CanalesSteve on 03/29/16 in the morning to discuss this, but she said she needs after 2pm.  Please advise.

## 2016-03-27 NOTE — Telephone Encounter (Signed)
o v next week after 2 pm

## 2016-03-28 NOTE — Telephone Encounter (Signed)
Discussed with pt that she should go to ed or urgent care for treatment for swollen arm. Pt wanted to go ahead and schedule office for next week after 2pm for back pain.

## 2016-03-28 NOTE — Telephone Encounter (Signed)
[facility] and [facility]

## 2016-03-28 NOTE — Telephone Encounter (Signed)
Patient said that her arm is swollen again and wants to know if Dr. Brett CanalesSteve wants her to wait until next week.  Call patient at [phone]

## 2016-03-28 NOTE — Telephone Encounter (Signed)
See message below. appt next week for hand pain not back pain.

## 2016-03-29 NOTE — Telephone Encounter (Signed)
ok 

## 2016-04-02 ENCOUNTER — Encounter: Payer: Self-pay | Admitting: Nurse Practitioner

## 2016-04-02 ENCOUNTER — Ambulatory Visit (INDEPENDENT_AMBULATORY_CARE_PROVIDER_SITE_OTHER): Payer: BC Managed Care – PPO | Admitting: Nurse Practitioner

## 2016-04-02 VITALS — BP 118/82 | Ht 64.0 in | Wt 218.2 lb

## 2016-04-02 DIAGNOSIS — G542 Cervical root disorders, not elsewhere classified: Secondary | ICD-10-CM

## 2016-04-02 DIAGNOSIS — R21 Rash and other nonspecific skin eruption: Secondary | ICD-10-CM | POA: Diagnosis not present

## 2016-04-02 MED ORDER — GABAPENTIN 300 MG PO CAPS: 300.0000 mg | ORAL_CAPSULE | Freq: Two times a day (BID) | ORAL | 2 refills | Status: DC

## 2016-04-02 NOTE — Progress Notes (Signed)
Subjective:  Presents for complaints of continued left arm pain unchanged from visit on 01/24/2016. Varies in intensity, worse with activity. Describes as a pinching pain along her arm especially in her left hand. Has seen her neurosurgeon Dr. Lovell SheehanJenkins, has been told there is nothing found to explain her symptoms. Pain is severe at times. Patient wishes to avoid opiate pain medication use. In fact, does not like to take medication at all. Is also limited as far as her funds to pay for visits and medications. Also has a chronic recurrent pruritic rash mainly on the arms has been going on for months. Patient describes small blisters that rupture, cause itching and sometimes burning.  Objective:   BP 118/82   Ht 5\' 4"  (1.626 m)   Wt 218 lb 3.2 oz (99 kg)   BMI 37.45 kg/m  NAD. Alert, oriented. Mildly anxious affect. Some difficulty keeping patient focused on issue at hand. Lungs clear. Heart regular rate rhythm. Radial pulses strong. Hand strength and arm strength 5+ bilateral. Sensation grossly intact. Scattered pink lesions few in number noted on the mid to lower arms some with excoriation. No evidence of active infection. 1 noted on the upper neck area near the chin. MRI cervical spine 02/02/2016 shows enlarged C4-5 disc protrusion with mild left C3-4 neural foraminal narrowing.  Assessment:  Cervical nerve root impingement  Rash    Plan:  Meds ordered this encounter  Medications  . gabapentin (NEURONTIN) 300 MG capsule    Sig: Take 1 capsule (300 mg total) by mouth 2 (two) times daily.    Dispense:  60 capsule    Refill:  2    Order Specific Question:   Supervising Provider    Answer:   Merlyn AlbertLUKING, WILLIAM S [[id number]]    Discussed options at length. Patient is reluctant to take medication but also has some difficulty with co-pays. Will refer to dermatology. Patient agrees to restart Neurontin as directed. No further referrals or workup at this time. 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 If this improves with Neurontin, will revisit this recommendation. Return in about 3 months (around 07/01/2016) for recheck.

## 2016-04-04 ENCOUNTER — Encounter: Payer: Self-pay | Admitting: Family Medicine

## 2016-04-22 ENCOUNTER — Encounter: Payer: Self-pay | Admitting: Family Medicine

## 2016-04-23 ENCOUNTER — Encounter: Payer: Self-pay | Admitting: Family Medicine

## 2016-04-23 ENCOUNTER — Ambulatory Visit (INDEPENDENT_AMBULATORY_CARE_PROVIDER_SITE_OTHER): Payer: BC Managed Care – PPO | Admitting: Family Medicine

## 2016-04-23 VITALS — BP 120/88 | Temp 98.8°F | Ht 64.0 in | Wt 222.1 lb

## 2016-04-23 DIAGNOSIS — J329 Chronic sinusitis, unspecified: Secondary | ICD-10-CM

## 2016-04-23 MED ORDER — CLARITHROMYCIN 500 MG PO TABS: 500.0000 mg | ORAL_TABLET | Freq: Two times a day (BID) | ORAL | 0 refills | Status: AC

## 2016-04-23 MED ORDER — ALBUTEROL SULFATE HFA 108 (90 BASE) MCG/ACT IN AERS: 2.0000 | INHALATION_SPRAY | Freq: Four times a day (QID) | RESPIRATORY_TRACT | 2 refills | Status: DC | PRN

## 2016-04-23 NOTE — Progress Notes (Signed)
   Subjective:    Patient ID: Mackenzie Gray, female    DOB: [birth year], 51 y.o.   MRN: [id number]  URI   This is a new problem. The current episode started in the past 7 days. The problem has been unchanged. Maximum temperature: low grade fever. Associated symptoms include abdominal pain, congestion, coughing, diarrhea, ear pain, headaches and a sore throat. She has tried antihistamine, acetaminophen and NSAIDs (gabapentin) for the symptoms. The treatment provided no relief.   strted sat scratchy throat  Bad cough for a long time   100.2   fevr  Energy level worn out  achey eveywhere    Some productive  fri night coming on with symtos    Cong in throa t sore and tender, coughing up gunky stuff   Review of Systems  HENT: Positive for congestion, ear pain and sore throat.   Respiratory: Positive for cough.   Gastrointestinal: Positive for abdominal pain and diarrhea.  Neurological: Positive for headaches.       Objective:   Physical Exam Alert moderate malaise. Plus minus frontal tenderness. Mild nasal congestion pharynx normal TMs good neck supple lungs clear. Heart regular rate and rhythm.       Assessment & Plan:  Impression rhinosinusitis post-influenza with bronchial element plan antibiotics prescribed symptom care discussed albuterol when necessary for cough and wheezing WSL

## 2016-07-04 ENCOUNTER — Ambulatory Visit: Payer: BC Managed Care – PPO | Admitting: Nurse Practitioner

## 2016-09-14 ENCOUNTER — Ambulatory Visit (INDEPENDENT_AMBULATORY_CARE_PROVIDER_SITE_OTHER): Payer: BC Managed Care – PPO | Admitting: Family Medicine

## 2016-09-14 ENCOUNTER — Encounter: Payer: Self-pay | Admitting: Family Medicine

## 2016-09-14 VITALS — BP 138/82 | Temp 98.4°F | Ht 64.0 in | Wt 217.0 lb

## 2016-09-14 DIAGNOSIS — J329 Chronic sinusitis, unspecified: Secondary | ICD-10-CM | POA: Diagnosis not present

## 2016-09-14 DIAGNOSIS — R300 Dysuria: Secondary | ICD-10-CM | POA: Diagnosis not present

## 2016-09-14 DIAGNOSIS — M940 Chondrocostal junction syndrome [Tietze]: Secondary | ICD-10-CM | POA: Diagnosis not present

## 2016-09-14 LAB — POCT URINALYSIS DIPSTICK
Leukocytes, UA: NEGATIVE
PH UA: 5 (ref 5.0–8.0)
RBC UA: NEGATIVE
Spec Grav, UA: >=1.03 — AB (ref 1.010–1.025)

## 2016-09-14 MED ORDER — ALBUTEROL SULFATE HFA 108 (90 BASE) MCG/ACT IN AERS: 2.0000 | INHALATION_SPRAY | Freq: Four times a day (QID) | RESPIRATORY_TRACT | 0 refills | Status: DC | PRN

## 2016-09-14 MED ORDER — FLUCONAZOLE 150 MG PO TABS: ORAL_TABLET | ORAL | 0 refills | Status: DC

## 2016-09-14 MED ORDER — AMOXICILLIN-POT CLAVULANATE 875-125 MG PO TABS: 1.0000 | ORAL_TABLET | Freq: Two times a day (BID) | ORAL | 0 refills | Status: DC

## 2016-09-14 NOTE — Progress Notes (Signed)
Subjective:    Patient ID: Mackenzie MewRobin Gray, female    DOB: 01/14/1966, 51 y.o.   MRN: [id number]  Sinusitis   Patient states she has had a cough, runny nose,states she has had diarrhea. The coughing has induced vomitting. Has chest pain, also she states both of her ears ache. She states she is the most tired as she has ever been.This has been going on for the last 2-3 days, since she had to turn her air conditioner on .  Pt states she is having painful urination and frequency,Is taking AZO,has helped some.  Results for orders placed or performed in visit on 07/18/15  CBC with Differential/Platelet  Result Value Ref Range   WBC 5.2 3.4 - 10.8 x10E3/uL   RBC 4.69 3.77 - 5.28 x10E6/uL   Hemoglobin 14.1 11.1 - 15.9 g/dL   Hematocrit 78.241.6 95.634.0 - 46.6 %   MCV 89 79 - 97 fL   MCH 30.1 26.6 - 33.0 pg   MCHC 33.9 31.5 - 35.7 g/dL   RDW 21.313.1 08.612.3 - 57.815.4 %   Platelets 260 150 - 379 x10E3/uL   Neutrophils 51 %   Lymphs 37 %   Monocytes 7 %   Eos 4 %   Basos 1 %   Neutrophils Absolute 2.7 1.4 - 7.0 x10E3/uL   Lymphocytes Absolute 1.9 0.7 - 3.1 x10E3/uL   Monocytes Absolute 0.4 0.1 - 0.9 x10E3/uL   EOS (ABSOLUTE) 0.2 0.0 - 0.4 x10E3/uL   Basophils Absolute 0.1 0.0 - 0.2 x10E3/uL   Immature Granulocytes 0 %   Immature Grans (Abs) 0.0 0.0 - 0.1 x10E3/uL  Lipid panel  Result Value Ref Range   Cholesterol, Total 183 100 - 199 mg/dL   Triglycerides 469143 0 - 149 mg/dL   HDL 57 >62>39 mg/dL   VLDL Cholesterol Cal 29 5 - 40 mg/dL   LDL Calculated 97 0 - 99 mg/dL   Chol/HDL Ratio 3.2 0.0 - 4.4 ratio units  Basic metabolic panel  Result Value Ref Range   Glucose 93 65 - 99 mg/dL   BUN 12 6 - 24 mg/dL   Creatinine, Ser 9.520.72 0.57 - 1.00 mg/dL   GFR calc non Af Amer 99 >59 mL/min/1.73   GFR calc Af Amer 114 >59 mL/min/1.73   BUN/Creatinine Ratio 17 9 - 23   Sodium 140 134 - 144 mmol/L   Potassium 4.9 3.5 - 5.2 mmol/L   Chloride 103 96 - 106 mmol/L   CO2 23 18 - 29 mmol/L   Calcium 9.2 8.7 - 10.2  mg/dL  TSH  Result Value Ref Range   TSH 2.110 0.450 - 4.500 uIU/mL  Hepatic function panel  Result Value Ref Range   Total Protein 6.9 6.0 - 8.5 g/dL   Albumin 4.2 3.5 - 5.5 g/dL   Bilirubin Total 0.4 0.0 - 1.2 mg/dL   Bilirubin, Direct 8.410.09 0.00 - 0.40 mg/dL   Alkaline Phosphatase 71 39 - 117 IU/L   AST 19 0 - 40 IU/L   ALT 27 0 - 32 IU/L  VITAMIN D 25 Hydroxy (Vit-D Deficiency, Fractures)  Result Value Ref Range   Vit D, 25-Hydroxy 27.2 (L) 30.0 - 100.0 ng/mL  POCT urinalysis dipstick  Result Value Ref Range   Color, UA Yellow    Clarity, UA Cloudy    Glucose, UA     Bilirubin, UA     Ketones, UA     Spec Grav, UA 1.020    Blood, UA Negative  pH, UA 5.0    Protein, UA     Urobilinogen, UA     Nitrite, UA     Leukocytes, UA Negative Negative     Cough and cong th past ten days   When a c was turned on got frontal heafaches, bilat ear discomfort, feltdrained  Low gr fever   Sleeping diminesd,, sleeping of and on temp 99 No flowsheet data found.    Review of Systems No headache, no major weight loss or weight gain, no chest pain no back pain abdominal pain no change in bowel habits complete ROS otherwise negative     Objective:   Physical Exam  Alert, mild malaise. Hydration good Vitals stable. frontal/ maxillary tenderness evident positive nasal congestion. pharynx normal neck supple  lungs clear/no crackles or wheezes. heart regular in rhythm Plus left anterior chest wall tenderness to deep palpation      Assessment & Plan:  Impression rhinosinusitis lWith bronchitis leading to secondary to chest wall pain ikely post viral, discussed with patient. plan antibiotics prescribed. Questions answered. Symptomatic care discussed. warning signs discussed. WSL

## 2017-01-25 ENCOUNTER — Ambulatory Visit (INDEPENDENT_AMBULATORY_CARE_PROVIDER_SITE_OTHER): Payer: BC Managed Care – PPO | Admitting: Family Medicine

## 2017-01-25 ENCOUNTER — Encounter: Payer: Self-pay | Admitting: Family Medicine

## 2017-01-25 ENCOUNTER — Encounter: Payer: Self-pay | Admitting: Nurse Practitioner

## 2017-01-25 VITALS — BP 136/90 | Temp 98.2°F | Wt 214.1 lb

## 2017-01-25 DIAGNOSIS — J019 Acute sinusitis, unspecified: Secondary | ICD-10-CM

## 2017-01-25 DIAGNOSIS — R509 Fever, unspecified: Secondary | ICD-10-CM

## 2017-01-25 MED ORDER — AMOXICILLIN-POT CLAVULANATE 875-125 MG PO TABS: 1.0000 | ORAL_TABLET | Freq: Two times a day (BID) | ORAL | 0 refills | Status: DC

## 2017-01-25 NOTE — Progress Notes (Signed)
   Subjective:    Patient ID: Mackenzie Gray, female    DOB: 1965/05/09, 51 y.o.   MRN: [id number]  Cough  This is a new problem. The current episode started yesterday. Associated symptoms include a fever, headaches, rhinorrhea and a sore throat. Pertinent negatives include no chest pain, ear pain, shortness of breath or wheezing. Associated symptoms comments: Vomiting, Diarrhea.  Viral-like illness head congestion drainage coughing sinus pressure denies high fever chills sweats Patient states no other concerns this visit.   Review of Systems  Constitutional: Positive for fever. Negative for activity change.  HENT: Positive for congestion, rhinorrhea and sore throat. Negative for ear pain.   Eyes: Negative for discharge.  Respiratory: Positive for cough. Negative for shortness of breath and wheezing.   Cardiovascular: Negative for chest pain.  Neurological: Positive for headaches.       Objective:   Physical Exam  Constitutional: She appears well-developed.  HENT:  Head: Normocephalic.  Right Ear: External ear normal.  Left Ear: External ear normal.  Nose: Nose normal.  Mouth/Throat: Oropharynx is clear and moist. No oropharyngeal exudate.  Eyes: Right eye exhibits no discharge. Left eye exhibits no discharge.  Neck: Neck supple. No tracheal deviation present.  Cardiovascular: Normal rate and normal heart sounds.  No murmur heard. Pulmonary/Chest: Effort normal and breath sounds normal. She has no wheezes. She has no rales.  Lymphadenopathy:    She has no cervical adenopathy.  Skin: Skin is warm and dry.  Nursing note and vitals reviewed.  It is possible the patient may have a mild parainfluenza or just a severe viral syndrome with secondary sinusitis I do not find evidence of any type of bacteremia or severe bacterial infection patient certainly was encouraged to follow-up immediately here or ER if worse       Assessment & Plan:  Viral syndrome Secondary  rhinosinusitis Antibiotics prescribed warning signs discussed follow-up if problems

## 2017-02-02 ENCOUNTER — Other Ambulatory Visit: Payer: Self-pay | Admitting: Family Medicine

## 2017-02-02 ENCOUNTER — Encounter: Payer: Self-pay | Admitting: Family Medicine

## 2017-02-04 MED ORDER — FLUCONAZOLE 150 MG PO TABS: ORAL_TABLET | ORAL | 0 refills | Status: DC

## 2017-02-07 ENCOUNTER — Encounter: Payer: Self-pay | Admitting: Family Medicine

## 2017-02-08 ENCOUNTER — Other Ambulatory Visit: Payer: Self-pay | Admitting: Nurse Practitioner

## 2017-02-08 MED ORDER — TERCONAZOLE 0.4 % VA CREA: 1.0000 | TOPICAL_CREAM | Freq: Every day | VAGINAL | 0 refills | Status: DC

## 2017-02-10 ENCOUNTER — Encounter: Payer: Self-pay | Admitting: Nurse Practitioner

## 2017-02-13 ENCOUNTER — Other Ambulatory Visit: Payer: Self-pay | Admitting: Nurse Practitioner

## 2017-02-13 MED ORDER — LEVOFLOXACIN 500 MG PO TABS: 500.0000 mg | ORAL_TABLET | Freq: Every day | ORAL | 0 refills | Status: DC

## 2017-03-03 ENCOUNTER — Encounter: Payer: Self-pay | Admitting: Nurse Practitioner

## 2017-04-09 ENCOUNTER — Telehealth: Payer: Self-pay

## 2017-04-09 ENCOUNTER — Encounter: Payer: Self-pay | Admitting: Family Medicine

## 2017-04-09 ENCOUNTER — Ambulatory Visit: Payer: BC Managed Care – PPO | Admitting: Family Medicine

## 2017-04-09 VITALS — BP 126/82 | Temp 98.1°F | Ht 64.0 in | Wt 220.4 lb

## 2017-04-09 DIAGNOSIS — J111 Influenza due to unidentified influenza virus with other respiratory manifestations: Secondary | ICD-10-CM

## 2017-04-09 MED ORDER — OSELTAMIVIR PHOSPHATE 75 MG PO CAPS: 75.0000 mg | ORAL_CAPSULE | Freq: Two times a day (BID) | ORAL | 0 refills | Status: AC

## 2017-04-09 NOTE — Telephone Encounter (Signed)
Patient was called by our front office staff to move her appt to 1pm today. Pt orginally sched for headache, nausea,headcongestion.While on the phone the pt told Tammy she was now expierencing chest pain.She was transferred to me and I asked what kind of pain it was an where the location of the pain was she states the pain was center of chest and felt as if someone was squeezing air out of her. It is a sporatic feeling.I advised that she hang up and call 911 or go to the Ed She states"I cant,I can not afford to go". I told her that was our advise.She wants to keep the appt today with our office at 1pm.

## 2017-04-09 NOTE — Progress Notes (Signed)
   Subjective:    Patient ID: Mackenzie Gray, female    DOB: 1965/03/29, 52 y.o.   MRN: [id number]  Cough  This is a new problem. The current episode started in the past 7 days. Associated symptoms include chest pain, ear pain, a fever, headaches, myalgias and nasal congestion. Associated symptoms comments: Vomiting and diarrhea and abdominal pain. Treatments tried: otc meds.    sund night started to feeling rough  Bad headache yest  A little fever t max 101  Dim energy  No enrgy   No flu shot this yr   Cough deep in chest   Burning pain and aching and discomfor t   Took delsym  Not wheezing sig yet        Review of Systems  Constitutional: Positive for fever.  HENT: Positive for ear pain.   Respiratory: Positive for cough.   Cardiovascular: Positive for chest pain.  Musculoskeletal: Positive for myalgias.  Neurological: Positive for headaches.       Objective:   Physical Exam Alert vitals reviewed, moderate malaise. Hydration good. Positive nasal congestion lungs no crackles or wheezes, no tachypnea, intermittent bronchial cough during exam heart regular rate and rhythm.        Assessment & Plan:  Impression influenza discussed at length. Ashby Dawesature of illness and potential sequela discussed. Plan Tamiflu prescribed if indicated and timing appropriate. Symptom care discussed. Warning signs discussed. WSL

## 2017-04-09 NOTE — Telephone Encounter (Signed)
Ok, correct advice, will see since pt unwilling

## 2017-04-16 ENCOUNTER — Encounter: Payer: Self-pay | Admitting: Family Medicine

## 2017-04-23 IMAGING — MR MR CERVICAL SPINE WO/W CM
4 of 9 series · 18 of 48 positions shown · IV contrast (20    MH)
Comparison: Cervical spine plain films 06/24/2014. MR cervical
spine 04/29/2014.

CLINICAL DATA: Neck pain. Symptom duration unspecified, but several
weeks.

EXAM:
MRI CERVICAL SPINE WITHOUT AND WITH CONTRAST
TECHNIQUE: Multiplanar and multiecho pulse sequences of the cervical spine, to
include the craniocervical junction and cervicothoracic junction,
were obtained according to standard protocol without and with
intravenous contrast.
CONTRAST:  15mL MULTIHANCE GADOBENATE DIMEGLUMINE 529 MG/ML IV SOLN

[Series 2: T2 · sagittal · 3.0mm · 0.39mm/px · 4 of 13 slices shown (1 of 3)]
[im 1/13]
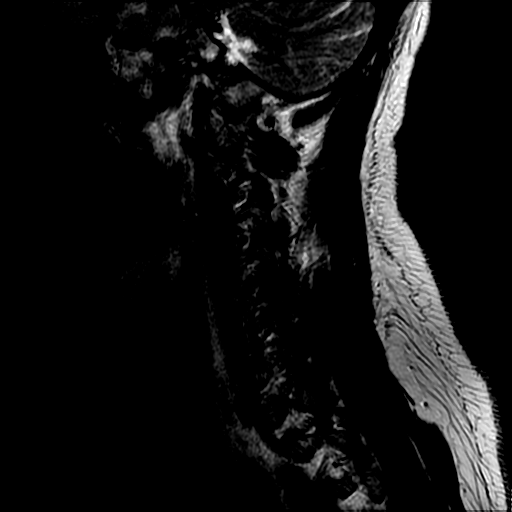
[im 5/13]
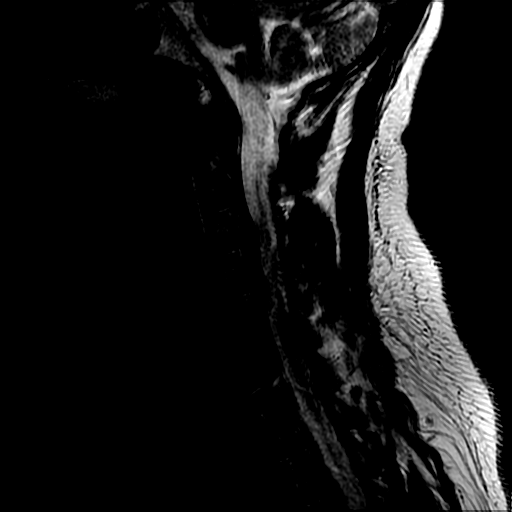
[im 9/13]
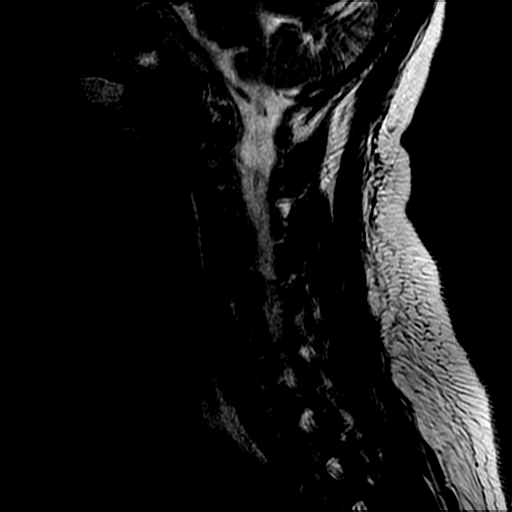
[im 13/13]
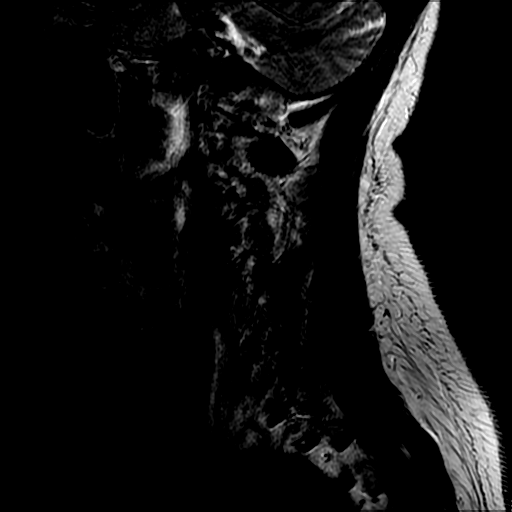

[Series 3: T1 · sagittal · 3.0mm · 0.39mm/px · 3 of 13 slices shown]
[im 1/13]
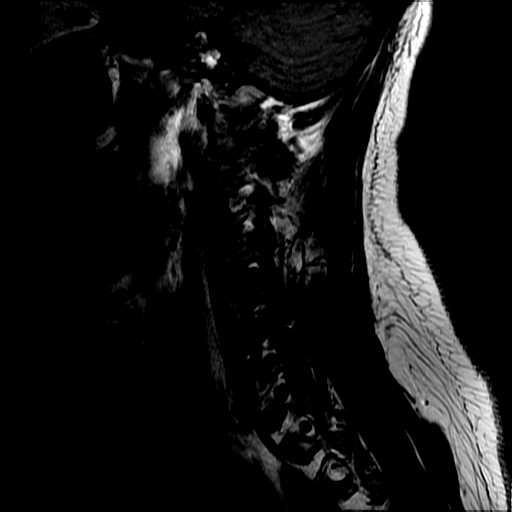
[im 7/13]
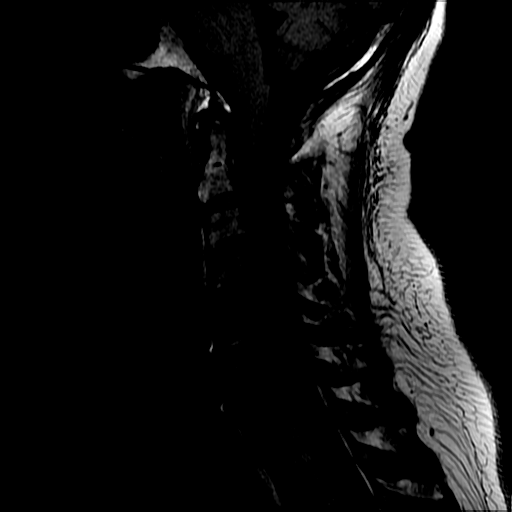
[im 13/13]
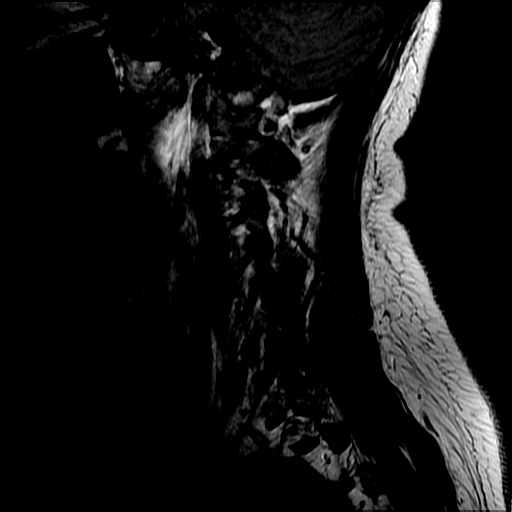

[Series 4: T2 · sagittal · 3.0mm · 0.39mm/px · 3 of 13 slices shown (2 of 3)]
[im 1/13]
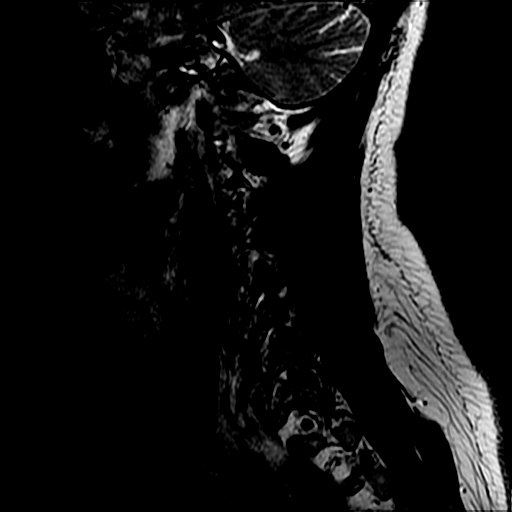
[im 7/13]
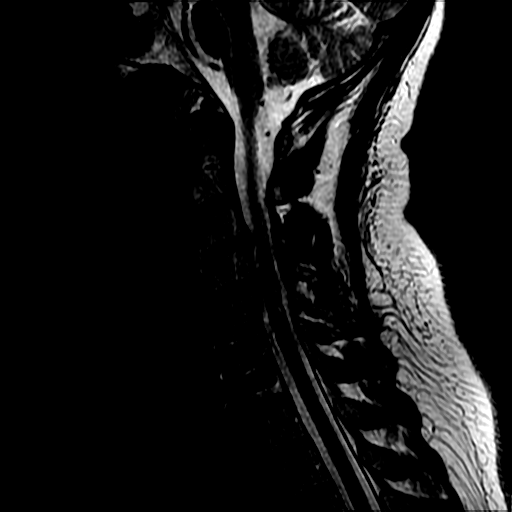
[im 13/13]
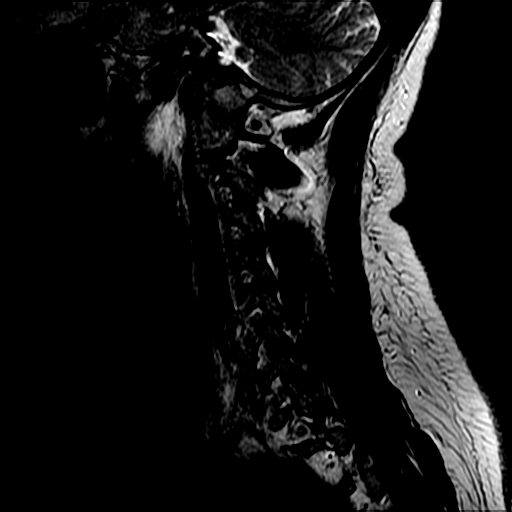

[Series 7: T2 · axial · 3.0mm · 0.39mm/px · z∈[-59,+46]mm · 8 of 32 slices shown (3 of 3)]
[im 1/32]
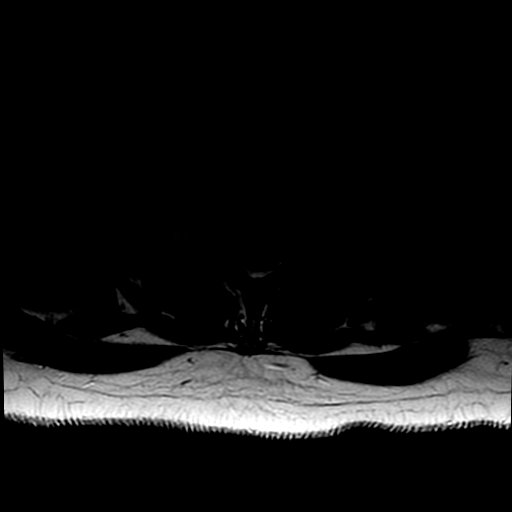
[im 5/32]
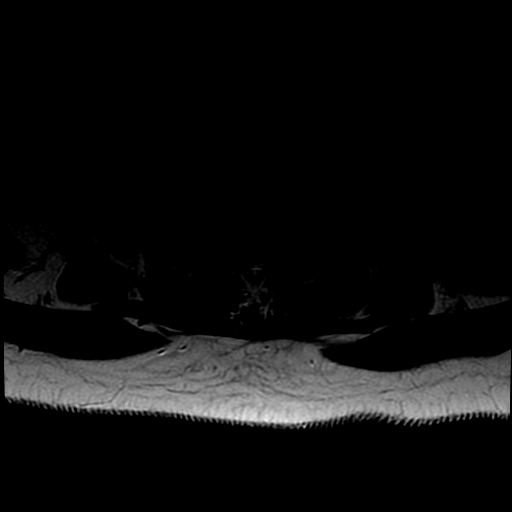
[im 9/32]
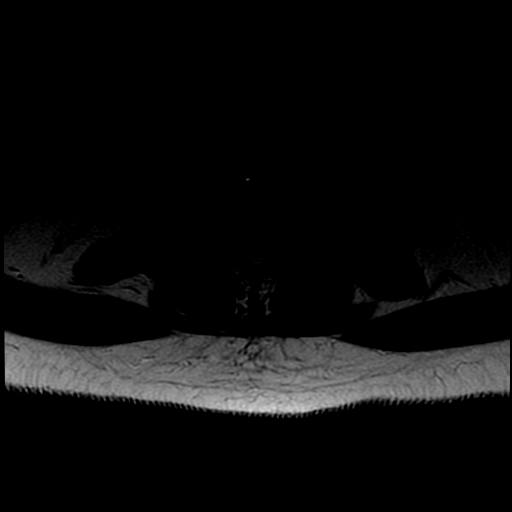
[im 14/32]
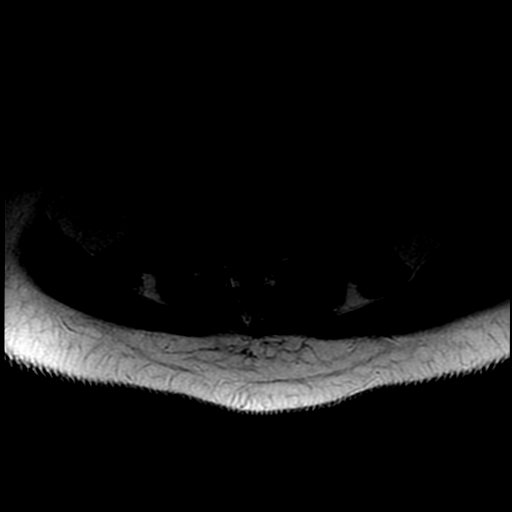
[im 18/32]
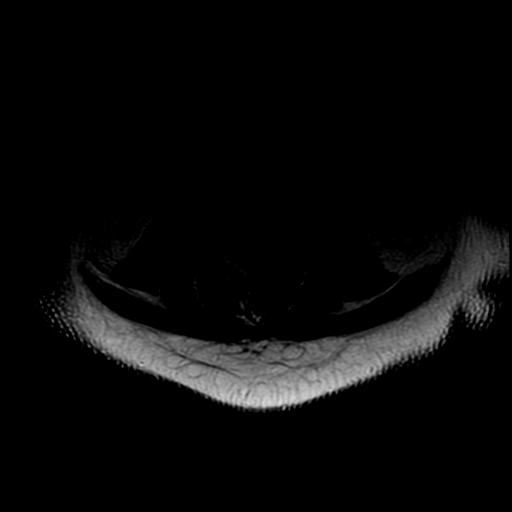
[im 23/32]
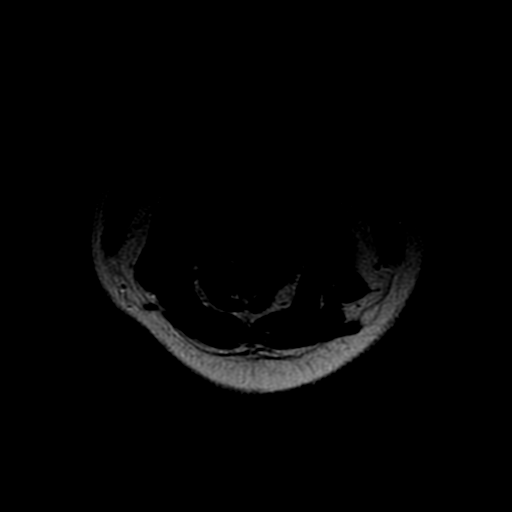
[im 27/32]
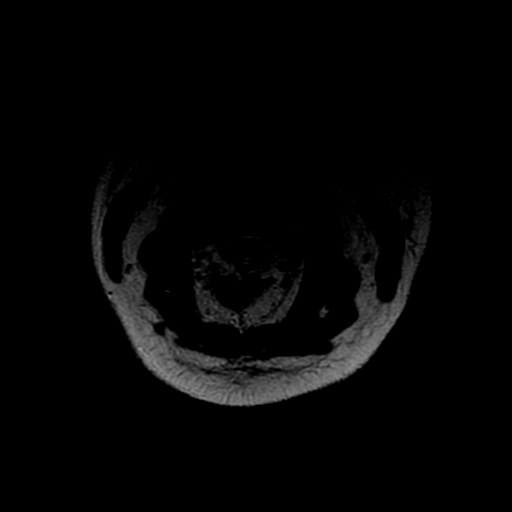
[im 32/32]
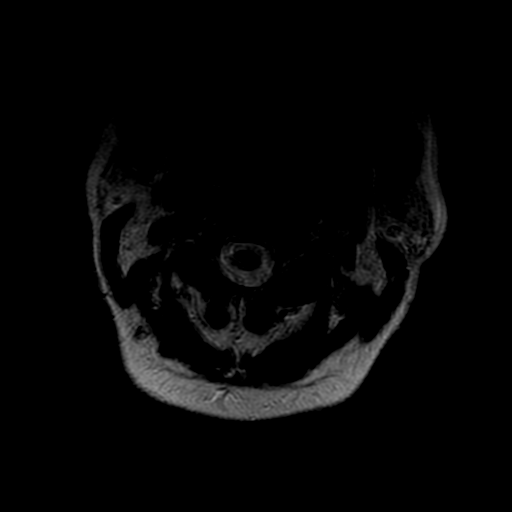

[18 of 48 positions shown; findings below may reference images not displayed]

FINDINGS: The patient was unable to remain motionless for the exam. Small or
subtle lesions could be overlooked.

Since the previous scan, the patient has undergone C6-7 ACDF with
anterior plating. Previous plate at C5-6 was removed. No
postoperative complications are evident. Previous disc osteophyte
complex at C6-7 on the LEFT is improved.

There is mild straightening of the normal cervical lordosis. There
is significant disc pathology at C3-4 and C4-5 both of which result
in cord compression described below. No definite abnormal cord
signal. Craniocervical junction unremarkable.

Abnormal postcontrast enhancement is confined to of disc pathology
described below. No features worrisome for infection. No abnormal
cord enhancement.

The individual disc spaces were examined as follows:

C2-3:  Normal.

C3-4: Central and leftward disc protrusion. The protrusion is
partially vascularized with postcontrast enhancement. Central canal
stenosis and cord flattening are worse on the LEFT. Canal diameter
is significantly narrowed, 6-7 mm. LEFT greater than RIGHT C4 nerve
root impingement is likely.

C4-5: Central disc extrusion. Peridiscal enhancement. Moderate cord
flattening without abnormal cord signal. Canal diameter
significantly narrowed at 6-7 mm. Either C5 nerve root impingement
is possible.

C5-6:  Unremarkable post fusion level.  No residual impingement.

C6-7:  Unremarkable post fusion level.  No residual impingement.

C7-T1:  Unremarkable.

Compared with the preoperative study from [facility], the appearance
at C6-7 is improved. The appearance at C4-5 is worsened.
IMPRESSION: Status post recent C6-7 ACDF with anterior plating. No postoperative
complications are evident.

Worsening adjacent segment disease at C4-5, now with a central disc
extrusion causing stenosis, cord compression, and potential
BILATERAL C5 nerve root impingement.

Significant disc disease at C3-4 is also present with a central and
leftward disc protrusion, causing stenosis and LEFT greater than
RIGHT C4 nerve root impingement.

## 2017-06-14 ENCOUNTER — Other Ambulatory Visit: Payer: Self-pay | Admitting: Nurse Practitioner

## 2017-07-25 ENCOUNTER — Encounter: Payer: Self-pay | Admitting: Family Medicine

## 2017-07-25 ENCOUNTER — Ambulatory Visit: Payer: BC Managed Care – PPO | Admitting: Family Medicine

## 2017-07-25 VITALS — BP 118/78 | Temp 98.4°F | Ht 64.0 in | Wt 222.2 lb

## 2017-07-25 DIAGNOSIS — H6021 Malignant otitis externa, right ear: Secondary | ICD-10-CM | POA: Diagnosis not present

## 2017-07-25 MED ORDER — AMOXICILLIN-POT CLAVULANATE 875-125 MG PO TABS: 1.0000 | ORAL_TABLET | Freq: Two times a day (BID) | ORAL | 0 refills | Status: AC

## 2017-07-25 MED ORDER — FLUCONAZOLE 150 MG PO TABS: ORAL_TABLET | ORAL | 0 refills | Status: DC

## 2017-07-25 MED ORDER — NEOMYCIN-POLYMYXIN-HC 3.5-10000-1 OT SOLN: 3.0000 [drp] | Freq: Four times a day (QID) | OTIC | 0 refills | Status: DC

## 2017-07-25 NOTE — Progress Notes (Signed)
   Subjective:    Patient ID: Mackenzie Gray, female    DOB: [birth year], 52 y.o.   MRN: [id number]  HPI Patient arrives with swelling in face, ear pain and cough for 2.5 weeks.  Started ten d ago, wk nd a half ao  Jaw swelling and painful  blod and disch on pillow  Sore to touch right ear   miseable with headfache   Deep cough  Review of Systems No headache, no major weight loss or weight gain, no chest pain no back pain abdominal pain no change in bowel habits complete ROS otherwise negative     Objective:   Physical Exam Alert active good hydration mild malaise.  Tender preauricular nodes right ear TM mostly obscured pharynx normal neck supple.  Lungs clear.  Heart regular rate and rhythm.  By wax/some external inflammation and swelling       Assessment & Plan:  Impression substantial external otitis with element of preauricular lymphadenitis discussed plan antibiotics prescribed symptom care discussed warning signs discussed  Greater than 50% of this 15 minute face to face visit was spent in counseling and discussion and coordination of care regarding the above diagnosis/diagnosies

## 2017-10-18 ENCOUNTER — Other Ambulatory Visit: Payer: Self-pay | Admitting: Family Medicine

## 2017-10-18 ENCOUNTER — Encounter: Payer: Self-pay | Admitting: Family Medicine

## 2017-10-18 MED ORDER — FLUCONAZOLE 150 MG PO TABS: ORAL_TABLET | ORAL | 0 refills | Status: DC

## 2017-10-18 NOTE — Telephone Encounter (Signed)
Contacted patient to get more information on symptoms. Pt is having a little bit of discharged and itching. Send Diflucan 150 mg into pharmacy

## 2017-12-16 ENCOUNTER — Ambulatory Visit: Payer: BC Managed Care – PPO | Admitting: Family Medicine

## 2017-12-16 ENCOUNTER — Encounter: Payer: Self-pay | Admitting: Family Medicine

## 2017-12-16 VITALS — Temp 98.1°F | Ht 64.0 in | Wt 216.8 lb

## 2017-12-16 DIAGNOSIS — M545 Low back pain: Secondary | ICD-10-CM | POA: Diagnosis not present

## 2017-12-16 DIAGNOSIS — S39012A Strain of muscle, fascia and tendon of lower back, initial encounter: Secondary | ICD-10-CM

## 2017-12-16 DIAGNOSIS — Z23 Encounter for immunization: Secondary | ICD-10-CM

## 2017-12-16 MED ORDER — ETODOLAC 400 MG PO TABS: ORAL_TABLET | ORAL | 0 refills | Status: DC

## 2017-12-16 MED ORDER — TIZANIDINE HCL 4 MG PO CAPS: ORAL_CAPSULE | ORAL | 0 refills | Status: DC

## 2017-12-16 NOTE — Progress Notes (Signed)
Subjective:    Patient ID: Mackenzie Gray, female    DOB: November 27, 1965, 52 y.o.   MRN: [id number]  Back Pain  This is a new problem. The current episode started in the past 7 days. Pain location: bottom of back from left side to right side. The quality of the pain is described as aching and stabbing. The symptoms are aggravated by bending (walking). (Nausea/vomiting x2) She has tried NSAIDs (Aleve, Ibuprofen) for the symptoms.   Woke up sat huritng low back  Across the hip  No dysuria   Nu burning with urin  Some nausea  Bending standing makes it worse  Has tried ibuprofen and aleave  Doesn't want pain kiler   no sig radiatio into the legs  Results for orders placed or performed in visit on 09/14/16  POCT urinalysis dipstick  Result Value Ref Range   Color, UA amber    Clarity, UA clear    Glucose, UA     Bilirubin, UA     Ketones, UA     Spec Grav, UA >=1.030 (A) 1.010 - 1.025   Blood, UA Negative    pH, UA 5.0 5.0 - 8.0   Protein, UA     Urobilinogen, UA  0.2 or 1.0 E.U./dL   Nitrite, UA     Leukocytes, UA Negative Negative                                                                               ++ +  Reasonable Including vom times one    Gave blood today            Review of Systems  Musculoskeletal: Positive for back pain.       Objective:   Physical Exam Alert vitals stable, NAD. Blood pressure good on repeat. HEENT normal. Lungs clear. Heart regular rate and rhythm. Diffuse low back pain question and palpation negative straight leg raise       Assessment & Plan:  Impression acute lumbar strain.  Discussed.  Anti-inflammatory medicine prescribed for muscle spasm medicine prescribed.  Flu shot for this request

## 2018-03-11 ENCOUNTER — Telehealth: Payer: Self-pay | Admitting: Family Medicine

## 2018-03-11 ENCOUNTER — Other Ambulatory Visit: Payer: Self-pay | Admitting: *Deleted

## 2018-03-11 MED ORDER — MAGIC MOUTHWASH: ORAL | 0 refills | Status: DC

## 2018-03-11 NOTE — Telephone Encounter (Signed)
Dad was recently treated for thrush.  She isn't sure if it is contagious but she drank after him and now she feels like she has a coating on her tongue and has spots in her mouth.  Could this be thrush also?    [place] apoth.

## 2018-03-11 NOTE — Telephone Encounter (Signed)
Med sent to pharm. Pt notified.  

## 2018-03-11 NOTE — Telephone Encounter (Signed)
Please advise. Thank you

## 2018-03-11 NOTE — Telephone Encounter (Signed)
Might be, we can treat, dukes mmw 16 oz one tblspoon swish and gargkle qid

## 2018-03-12 ENCOUNTER — Encounter: Payer: Self-pay | Admitting: Family Medicine

## 2018-03-12 ENCOUNTER — Other Ambulatory Visit (HOSPITAL_COMMUNITY): Payer: Self-pay | Admitting: Family Medicine

## 2018-03-12 ENCOUNTER — Ambulatory Visit: Payer: BC Managed Care – PPO | Admitting: Family Medicine

## 2018-03-12 VITALS — Temp 98.5°F | Wt 218.4 lb

## 2018-03-12 DIAGNOSIS — Z1231 Encounter for screening mammogram for malignant neoplasm of breast: Secondary | ICD-10-CM

## 2018-03-12 DIAGNOSIS — J329 Chronic sinusitis, unspecified: Secondary | ICD-10-CM | POA: Diagnosis not present

## 2018-03-12 MED ORDER — CEFPROZIL 500 MG PO TABS: 500.0000 mg | ORAL_TABLET | Freq: Two times a day (BID) | ORAL | 0 refills | Status: DC

## 2018-03-12 NOTE — Progress Notes (Signed)
   Subjective:    Patient ID: Mackenzie Gray, female    DOB: September 15, 1965, 53 y.o.   MRN: [id number]  HPI Pt here today for possible thrush. Coating on tongue, fuzzy feeling on tounge and inside of mouth is raw and blistered. Going on for about 3 weeks.      Coughing, diarrhea, runny nose, ear pain, fever, abdominal pain, and pt states that her back teeth have had a gooey substance every morning and this usually happens when she gets a sinus infection. Pt has tried 50/50 gargle of water and peroxide and antiseptic mouthwash  Coating aon the tongue    Pain int the sinuses and cong   hads some gunky dranage   Temp  Low gr up and down   Low gr  No prod cough    . We did call in Dukes Magic Mouthwash but patient has not started using it yet.   Review of Systems No headache, no major weight loss or weight gain, no chest pain no back pain abdominal pain no change in bowel habits complete ROS otherwise negative     Objective:   Physical Exam  Alert, mild malaise. Hydration good Vitals stable. frontal/ maxillary tenderness evident positive nasal congestion. pharynx normal neck supple  lungs clear/no crackles or wheezes. heart regular in rhythm       Assessment & Plan:  Impression rhinosinusitis likely post viral, discussed with patient. plan antibiotics prescribed. Questions answered. Symptomatic care discussed. warning signs discussed. WSL

## 2018-03-17 ENCOUNTER — Encounter: Payer: Self-pay | Admitting: Family Medicine

## 2018-03-17 ENCOUNTER — Ambulatory Visit: Payer: BC Managed Care – PPO | Admitting: Family Medicine

## 2018-03-17 VITALS — BP 122/88 | Temp 98.6°F | Ht 64.0 in | Wt 221.0 lb

## 2018-03-17 DIAGNOSIS — R197 Diarrhea, unspecified: Secondary | ICD-10-CM | POA: Diagnosis not present

## 2018-03-17 DIAGNOSIS — J019 Acute sinusitis, unspecified: Secondary | ICD-10-CM | POA: Diagnosis not present

## 2018-03-17 MED ORDER — DOXYCYCLINE HYCLATE 100 MG PO TABS: 100.0000 mg | ORAL_TABLET | Freq: Two times a day (BID) | ORAL | 0 refills | Status: DC

## 2018-03-17 NOTE — Progress Notes (Signed)
Subjective:    Patient ID: Mackenzie Gray, female    DOB: [birth year], 53 y.o.   MRN: [id number]  HPI  Patient is here today with complaints of diarrhea and nausea.She states this has been on going since the beginning of the year. She was seen 03/12/2018 for this. She works in [employer] at [employer].Was given Cefzil and started taking it this past weekend.  She has been having a raw feeling in her mouth. Has been doing the [facility]'s mouthwash.   Sinus drainage for the last four weeks.She is coughing up green mucous.Taking Delsym. Reports nausea and diarrhea d/t sinus drainage per patient. Reports diarrhea daily 3-4x per day, reports looks like "slime," liquid stool, denies blood in stool. Trying to stay hydrated. Lower abdominal cramping prior to having BM.   She had a fever this am of 100.1. She has been taking Ibuprofen. Reports sleeping more. Intermittent fevers. H/a across entire head - intermittent.    Review of Systems  Constitutional: Positive for fatigue and fever.  HENT: Positive for congestion, ear pain and sinus pressure.   Respiratory: Positive for cough. Negative for wheezing.   Gastrointestinal: Positive for abdominal pain, diarrhea and nausea. Negative for blood in stool and vomiting.  Neurological: Positive for headaches.       Objective:   Physical Exam Vitals signs and nursing note reviewed.  Constitutional:      General: She is not in acute distress.    Appearance: She is not toxic-appearing.     Comments: Mild malaise  HENT:     Head: Normocephalic and atraumatic.     Right Ear: Tympanic membrane normal.     Left Ear: Tympanic membrane normal.     Nose: Congestion present.     Right Sinus: No maxillary sinus tenderness or frontal sinus tenderness.     Left Sinus: Maxillary sinus tenderness and frontal sinus tenderness present.     Mouth/Throat:     Mouth: Mucous membranes are moist.     Pharynx: Oropharynx is clear. Uvula midline.  Eyes:       General:        Right eye: No discharge.        Left eye: No discharge.  Neck:     Musculoskeletal: Neck supple. No neck rigidity.  Cardiovascular:     Rate and Rhythm: Normal rate and regular rhythm.     Heart sounds: Normal heart sounds.  Pulmonary:     Effort: Pulmonary effort is normal. No respiratory distress.     Breath sounds: Rhonchi (expiration, RLL) present. No rales.  Abdominal:     General: Bowel sounds are normal. There is no distension.     Palpations: Abdomen is soft. There is no mass.     Tenderness: There is generalized abdominal tenderness.  Lymphadenopathy:     Cervical: No cervical adenopathy.  Skin:    General: Skin is warm and dry.  Neurological:     Mental Status: She is alert and oriented to person, place, and time.           Assessment & Plan:  1. Acute rhinosinusitis  Will stop cefzil since no improvement and start on doxycycline. Do not feel chest xray is needed at this time. No pna heard on exam, but doxycycline should cover for that potential. Oxygen saturation normal, pt speaking in complete sentences. Symptomatic care discussed. Warning signs discussed. F/u if symptoms worsen or fail to improve.  2. Diarrhea, unspecified type - Plan: CBC with  Differential/Platelet, Basic metabolic panel, Hepatic function panel Will obtain some basic lab work, f/u based on results. If normal and diarrhea persists over the next 1-2 weeks she should f/u and we will obtain stool testing at that time.   Dr. Lubertha South was consulted on this case and is in agreement with the above treatment plan.

## 2018-03-18 ENCOUNTER — Encounter: Payer: Self-pay | Admitting: Family Medicine

## 2018-03-18 LAB — CBC WITH DIFFERENTIAL/PLATELET
Basophils Absolute: 0.1 10*3/uL (ref 0.0–0.2)
Basos: 1 %
EOS (ABSOLUTE): 0.2 10*3/uL (ref 0.0–0.4)
Eos: 3 %
Hematocrit: 41.5 % (ref 34.0–46.6)
Hemoglobin: 13.6 g/dL (ref 11.1–15.9)
IMMATURE GRANS (ABS): 0 10*3/uL (ref 0.0–0.1)
Immature Granulocytes: 0 %
Lymphocytes Absolute: 2.1 10*3/uL (ref 0.7–3.1)
Lymphs: 27 %
MCH: 29.2 pg (ref 26.6–33.0)
MCHC: 32.8 g/dL (ref 31.5–35.7)
MCV: 89 fL (ref 79–97)
Monocytes Absolute: 0.6 10*3/uL (ref 0.1–0.9)
Monocytes: 7 %
Neutrophils Absolute: 5 10*3/uL (ref 1.4–7.0)
Neutrophils: 62 %
Platelets: 305 10*3/uL (ref 150–450)
RBC: 4.65 x10E6/uL (ref 3.77–5.28)
RDW: 12.9 % (ref 11.7–15.4)
WBC: 8 10*3/uL (ref 3.4–10.8)

## 2018-03-18 LAB — BASIC METABOLIC PANEL
BUN/Creatinine Ratio: 18 (ref 9–23)
BUN: 10 mg/dL (ref 6–24)
CO2: 21 mmol/L (ref 20–29)
Calcium: 9.1 mg/dL (ref 8.7–10.2)
Chloride: 105 mmol/L (ref 96–106)
Creatinine, Ser: 0.57 mg/dL (ref 0.57–1.00)
GFR calc Af Amer: 124 mL/min/{1.73_m2} (ref >59)
GFR calc non Af Amer: 108 mL/min/{1.73_m2} (ref >59)
Glucose: 85 mg/dL (ref 65–99)
Potassium: 4.3 mmol/L (ref 3.5–5.2)
Sodium: 140 mmol/L (ref 134–144)

## 2018-03-18 LAB — HEPATIC FUNCTION PANEL
ALT: 25 IU/L (ref 0–32)
AST: 19 IU/L (ref 0–40)
Albumin: 4.5 g/dL (ref 3.5–5.5)
Alkaline Phosphatase: 69 IU/L (ref 39–117)
Bilirubin Total: 0.3 mg/dL (ref 0.0–1.2)
Bilirubin, Direct: 0.09 mg/dL (ref 0.00–0.40)
Total Protein: 7 g/dL (ref 6.0–8.5)

## 2018-03-19 ENCOUNTER — Encounter: Payer: Self-pay | Admitting: Family Medicine

## 2018-04-09 ENCOUNTER — Encounter (HOSPITAL_COMMUNITY): Payer: Self-pay

## 2018-04-09 ENCOUNTER — Ambulatory Visit (HOSPITAL_COMMUNITY)
Admission: RE | Admit: 2018-04-09 | Discharge: 2018-04-09 | Disposition: A | Discharge status: Home / Self Care | Payer: BC Managed Care – PPO | Source: Ambulatory Visit | Attending: Family Medicine | Admitting: Family Medicine

## 2018-04-09 DIAGNOSIS — Z1231 Encounter for screening mammogram for malignant neoplasm of breast: Secondary | ICD-10-CM | POA: Diagnosis not present

## 2018-04-10 ENCOUNTER — Other Ambulatory Visit (HOSPITAL_COMMUNITY): Payer: Self-pay | Admitting: Family Medicine

## 2018-04-10 DIAGNOSIS — R928 Other abnormal and inconclusive findings on diagnostic imaging of breast: Secondary | ICD-10-CM

## 2018-04-12 ENCOUNTER — Encounter: Payer: Self-pay | Admitting: Family Medicine

## 2018-04-24 ENCOUNTER — Ambulatory Visit (HOSPITAL_COMMUNITY): Payer: BC Managed Care – PPO

## 2018-04-24 ENCOUNTER — Ambulatory Visit (HOSPITAL_COMMUNITY)
Admission: RE | Admit: 2018-04-24 | Discharge: 2018-04-24 | Disposition: A | Discharge status: Home / Self Care | Payer: BC Managed Care – PPO | Source: Ambulatory Visit | Attending: Family Medicine | Admitting: Family Medicine

## 2018-04-24 DIAGNOSIS — R928 Other abnormal and inconclusive findings on diagnostic imaging of breast: Secondary | ICD-10-CM | POA: Diagnosis not present

## 2018-05-05 ENCOUNTER — Ambulatory Visit: Payer: BC Managed Care – PPO | Admitting: Family Medicine

## 2018-05-21 ENCOUNTER — Encounter: Payer: Self-pay | Admitting: Family Medicine

## 2018-05-21 ENCOUNTER — Other Ambulatory Visit: Payer: Self-pay | Admitting: *Deleted

## 2018-05-21 MED ORDER — CLARITHROMYCIN 500 MG PO TABS: 500.0000 mg | ORAL_TABLET | Freq: Two times a day (BID) | ORAL | 0 refills | Status: DC

## 2018-05-21 NOTE — Telephone Encounter (Signed)
Prescription sent electronically to pharmacy  Left message to return call 

## 2018-05-21 NOTE — Telephone Encounter (Signed)
See pt call

## 2018-05-21 NOTE — Telephone Encounter (Signed)
Message sent through  No fever just Head aches, tired, chills, sinus drainage and cough... have taken Delsym and a few over the counter for symptoms.   Called pt and got more infor. She started having cough over the weekend. Thinks it is due to allergies. Monday headache, chills last night, tired today. Sinus drainage is clear. No fever, no sob. Delsym helped a little   [place] [facility]

## 2018-05-21 NOTE — Telephone Encounter (Signed)
biaxin 500 bid ten d 

## 2018-05-22 NOTE — Telephone Encounter (Signed)
Pt.notified

## 2018-05-29 ENCOUNTER — Encounter: Payer: Self-pay | Admitting: Family Medicine

## 2018-06-01 ENCOUNTER — Encounter: Payer: Self-pay | Admitting: Family Medicine

## 2018-06-02 ENCOUNTER — Ambulatory Visit (INDEPENDENT_AMBULATORY_CARE_PROVIDER_SITE_OTHER): Payer: BC Managed Care – PPO | Admitting: Family Medicine

## 2018-06-02 ENCOUNTER — Encounter: Payer: Self-pay | Admitting: Family Medicine

## 2018-06-02 ENCOUNTER — Other Ambulatory Visit: Payer: Self-pay

## 2018-06-02 DIAGNOSIS — R21 Rash and other nonspecific skin eruption: Secondary | ICD-10-CM

## 2018-06-02 NOTE — Progress Notes (Signed)
   Subjective:    Patient ID: Mackenzie Gray, female    DOB: 01/08/1966, 53 y.o.   MRN: [id number]  HPI  Patient sent in picture through my chart of rash that developed after exposure to bleach product. Dr Brett Canales evaluated picture and reviewed symptoms and advised the patient that she may use OTC Hydrocodone cream as needed to rash and the Patient will be given work excuse as requested. 10 minutes spent on this call   Review of Systems     Objective:   Physical Exam        Assessment & Plan:

## 2018-06-02 NOTE — Telephone Encounter (Signed)
Need to find out if I can get a Doctors note till Wednesday for this rash from bleach , from Doctor Brett Canales please call me and let me know

## 2018-06-02 NOTE — Telephone Encounter (Signed)
Please call pt's cell she is unable to receive mychart messages right now

## 2018-06-16 ENCOUNTER — Other Ambulatory Visit: Payer: Self-pay

## 2018-06-16 ENCOUNTER — Ambulatory Visit (INDEPENDENT_AMBULATORY_CARE_PROVIDER_SITE_OTHER): Payer: BC Managed Care – PPO | Admitting: Family Medicine

## 2018-06-16 ENCOUNTER — Encounter: Payer: Self-pay | Admitting: Family Medicine

## 2018-06-16 DIAGNOSIS — R509 Fever, unspecified: Secondary | ICD-10-CM

## 2018-06-16 DIAGNOSIS — J329 Chronic sinusitis, unspecified: Secondary | ICD-10-CM | POA: Diagnosis not present

## 2018-06-16 MED ORDER — CEFDINIR 300 MG PO CAPS: 300.0000 mg | ORAL_CAPSULE | Freq: Two times a day (BID) | ORAL | 0 refills | Status: DC

## 2018-06-16 NOTE — Progress Notes (Signed)
   Subjective:    Patient ID: Mackenzie Gray, female    DOB: 21-Dec-1965, 53 y.o.   MRN: [id number] Audio only [facility] therefore regular codes Cough  This is a new problem. The current episode started yesterday. Associated symptoms include a fever, headaches and nasal congestion. Associated symptoms comments: Low grade fever. Treatments tried: vicks vapor rub.      Review of Systems  Constitutional: Positive for fever.  Respiratory: Positive for cough.   Neurological: Positive for headaches.   Virtual Visit via Video Note  I connected with Mackenzie Gray on 06/16/18 at  3:30 PM EDT by a video enabled telemedicine application and verified that I am speaking with the correct person using two identifiers.   I discussed the limitations of evaluation and management by telemedicine and the availability of in person appointments. The patient expressed understanding and agreed to proceed.  History of Present Illness:    Observations/Objective:   Assessment and Plan:   Follow Up Instructions:    I discussed the assessment and treatment plan with the patient. The patient was provided an opportunity to ask questions and all were answered. The patient agreed with the plan and demonstrated an understanding of the instructions.   The patient was advised to call back or seek an in-person evaluation if the symptoms worsen or if the condition fails to improve as anticipated.  I provided 15 minutes of non-face-to-face time during this encounter.         Objective:   Physical Exam   Virtual visit     Assessment & Plan:  Impression rhinosinusitis/bronchitis.  Post allergy.  However with achiness.  And low-grade fever.  And chest symptoms.  Cannot be 100% sure not coronavirus.  Very long discussion held.  Avoidance measures discussed.  Weight tilt a full 7 days after symptoms start in 3 days after fever gone before exposures to others.  Work excuse this week.  Avoid elderly  relatives antibiotics prescribed.

## 2018-06-17 ENCOUNTER — Encounter: Payer: Self-pay | Admitting: Family Medicine

## 2018-06-17 NOTE — Telephone Encounter (Signed)
done

## 2018-07-18 ENCOUNTER — Encounter: Payer: Self-pay | Admitting: Family Medicine

## 2018-07-18 MED ORDER — DOXYCYCLINE HYCLATE 100 MG PO TABS: 100.0000 mg | ORAL_TABLET | Freq: Two times a day (BID) | ORAL | 0 refills | Status: DC

## 2018-09-03 ENCOUNTER — Encounter: Payer: Self-pay | Admitting: Family Medicine

## 2018-09-03 MED ORDER — DOXYCYCLINE HYCLATE 100 MG PO TABS: ORAL_TABLET | ORAL | 0 refills | Status: DC

## 2018-09-03 NOTE — Telephone Encounter (Signed)
Per Dr Richardson Landry: Doxy 100 bid seven d, topical otc antibiotic oint

## 2018-09-04 NOTE — Telephone Encounter (Signed)
Pt contacted and verbalized understanding.  

## 2018-09-09 ENCOUNTER — Encounter: Payer: Self-pay | Admitting: Family Medicine

## 2018-09-09 MED ORDER — TRIAMTERENE-HCTZ 37.5-25 MG PO CAPS: ORAL_CAPSULE | ORAL | 2 refills | Status: DC

## 2018-09-09 NOTE — Telephone Encounter (Signed)
Per Dr Richardson Landry: Dyazide 37.5/25 numb 30, one up to three times per wk prn swelling, two ref

## 2018-09-24 ENCOUNTER — Encounter: Payer: Self-pay | Admitting: Family Medicine

## 2018-09-25 ENCOUNTER — Ambulatory Visit (INDEPENDENT_AMBULATORY_CARE_PROVIDER_SITE_OTHER): Payer: BC Managed Care – PPO | Admitting: Family Medicine

## 2018-09-25 ENCOUNTER — Other Ambulatory Visit: Payer: BC Managed Care – PPO

## 2018-09-25 ENCOUNTER — Other Ambulatory Visit: Payer: Self-pay

## 2018-09-25 DIAGNOSIS — Z20822 Contact with and (suspected) exposure to covid-19: Secondary | ICD-10-CM

## 2018-09-25 DIAGNOSIS — J329 Chronic sinusitis, unspecified: Secondary | ICD-10-CM | POA: Diagnosis not present

## 2018-09-25 DIAGNOSIS — K921 Melena: Secondary | ICD-10-CM

## 2018-09-25 MED ORDER — CEFDINIR 300 MG PO CAPS: 300.0000 mg | ORAL_CAPSULE | Freq: Two times a day (BID) | ORAL | 0 refills | Status: DC

## 2018-09-25 NOTE — Progress Notes (Signed)
   Subjective:    Patient ID: Mackenzie Gray, female    DOB: May 19, 1965, 53 y.o.   MRN: [id number]  Abdominal Pain This is a new problem. Episode onset: 3 weeks. The problem occurs constantly. The pain is located in the LLQ. Associated symptoms comments: Rectal bleeding, low grade fever.   headache, sinus drainage, sob with activity, fatigue, cough, water taste metallic. Symptoms started in march. Took doxy and no better.   Virtual Visit via Video Note  I connected with Mackenzie Gray on 09/25/18 at 11:30 AM EDT by a video enabled telemedicine application and verified that I am speaking with the correct person using two identifiers.  Location: Patient: home Provider: office   I discussed the limitations of evaluation and management by telemedicine and the availability of in person appointments. The patient expressed understanding and agreed to proceed.  History of Present Illness:    Observations/Objective:   Assessment and Plan:   Follow Up Instructions:    I discussed the assessment and treatment plan with the patient. The patient was provided an opportunity to ask questions and all were answered. The patient agreed with the plan and demonstrated an understanding of the instructions.   The patient was advised to call back or seek an in-person evaluation if the symptoms worsen or if the condition fails to improve as anticipated.  I provided20 minutes of non-face-to-face time during this encounter.  At times notes intermittent blood from rectum.  Not a large amount.  But definitely more than just spotting.  Associated with abdominal pain  Review of Systems  Gastrointestinal: Positive for abdominal pain.       Objective:   Physical Exam   Virtual     Assessment & Plan:  Impression rhinosinusitis antibiotics prescribed symptom care discussed  2.  Hematochezia discussed.  Time for GI referral rationale discussed

## 2018-09-25 NOTE — Telephone Encounter (Signed)
Contacted patient. Pt states she has been having blood in stool for 3 weeks; bright red clots; lower abdominal pain; Shortness of breath upon exertion, fluctuating low grade fever, tired, runny nose, cough, small headache, taste is weird. Dr.Steve recommends virtual visit. Pt transferred up front to be placed on schedule

## 2018-09-28 LAB — NOVEL CORONAVIRUS, NAA: SARS-CoV-2, NAA: NOT DETECTED

## 2018-10-06 ENCOUNTER — Encounter: Payer: Self-pay | Admitting: Internal Medicine

## 2018-10-09 ENCOUNTER — Encounter: Payer: Self-pay | Admitting: Family Medicine

## 2018-10-10 MED ORDER — CEFPROZIL 500 MG PO TABS: 500.0000 mg | ORAL_TABLET | Freq: Two times a day (BID) | ORAL | 0 refills | Status: DC

## 2018-10-12 ENCOUNTER — Encounter: Payer: Self-pay | Admitting: Family Medicine

## 2018-10-13 ENCOUNTER — Telehealth: Payer: Self-pay

## 2018-10-13 ENCOUNTER — Encounter: Payer: Self-pay | Admitting: Gastroenterology

## 2018-10-13 ENCOUNTER — Other Ambulatory Visit: Payer: Self-pay

## 2018-10-13 ENCOUNTER — Ambulatory Visit: Payer: BC Managed Care – PPO | Admitting: Gastroenterology

## 2018-10-13 VITALS — BP 148/95 | HR 100 | Temp 97.1°F | Ht 64.0 in | Wt 231.4 lb

## 2018-10-13 DIAGNOSIS — R1032 Left lower quadrant pain: Secondary | ICD-10-CM | POA: Diagnosis not present

## 2018-10-13 DIAGNOSIS — K625 Hemorrhage of anus and rectum: Secondary | ICD-10-CM | POA: Diagnosis not present

## 2018-10-13 NOTE — Telephone Encounter (Signed)
CT abd/pelvis w/contrast approved. Order ID: [id number]. Valid 10/13/18-04/10/19.   CT scheduled for 10/17/18 at 12:30pm, arrive at 12:15pm. NPO 4 hours prior to test. Pick up contrast prior to test. Will need bun/creat prior to test.  Called and informed pt of appt. She would like to have bun/creat done at [facility].

## 2018-10-13 NOTE — Progress Notes (Signed)
Primary Care Physician:  Merlyn AlbertLuking, William S, MD Primary Gastroenterologist:  Dr. Jena Gaussourk   Chief Complaint  Patient presents with  . Rectal Bleeding    bright red  . Abdominal Pain    HPI:   Mackenzie Gray is a 53 y.o. female presenting today at the request of Dr. Gerda DissLuking due to rectal bleeding. Last seen at [facility] as outpatient in Dec 2016. History of erosive reflux esophagitis and internal hemorrhoids.   States she has had off and on rectal bleeding. This is the worse episode. States for 2 weeks bled about a coffee cup full of blood. Mild rectal burning and itching with bleeding. Bleeding has tapered off. Rectal discomfort improved. Pain continues. Will go through periods of constipation and swell, then will let loose. Low-grade fever during the bleeding, 100-100.2.    Has a "knot" in RLQ that "floats" and goes up and down right side. LLQ sharp pain at least once a day out of the blue. Will double her over. Nothing precipitates or relieves. Started when rectal bleeding started. No NSAIDs. Bristol stool scale 5-7 since last colonoscopy. About twice per day. Chronic. Usually postprandially. Tired all the time. Moreso lately. Chronic.   States her Hgb was 14 when she gave blood last week. Not on a PPI. Staying away from soda. Helped with GERD symptoms. Declining PPI. Declining blood work. Would like to hold off on colonoscopy now. Deferred rectal exam.   Past Medical History:  Diagnosis Date  . ADD (attention deficit disorder)    Adult  . Anxiety   . Bronchitis   . Depression   . Fatty liver    CT 2013  . GERD (gastroesophageal reflux disease)   . Hypertension   . Hypoestrogenism   . IBS (irritable bowel syndrome)   . Migraines   . PONV (postoperative nausea and vomiting)     Past Surgical History:  Procedure Laterality Date  . ABDOMINAL HYSTERECTOMY    . CARPAL TUNNEL RELEASE Left 09/09/2014   Procedure: LEFT CARPAL TUNNEL RELEASE;  Surgeon: Betha LoaKevin Kuzma, MD;  Location: [facility];  Service: Orthopedics;  Laterality: Left;  . COLONOSCOPY    . COLONOSCOPY  10/2007   RMR: normal TI, normal colon, internal hemorrhoids  . COLONOSCOPY N/A 02/04/2015   RMR: Grade 2-3 hemorrhoid likely source of hematochezia; otherwise normal ileo-colonoscopy.   . COLONOSCOPY WITH ESOPHAGOGASTRODUODENOSCOPY (EGD)  2004   RMR: prepyloric ulcer, negative bx, internal hemorrhoids  . disc removal 5-6 vert    . ESOPHAGOGASTRODUODENOSCOPY    . ESOPHAGOGASTRODUODENOSCOPY N/A 02/04/2015   RMR: Erosive reflux esophagitis  . SPINAL FUSION     X 2  . TOTAL ABDOMINAL HYSTERECTOMY W/ BILATERAL SALPINGOOPHORECTOMY Left   . TUBAL LIGATION      Current Outpatient Medications  Medication Sig Dispense Refill  . albuterol (PROVENTIL HFA;VENTOLIN HFA) 108 (90 Base) MCG/ACT inhaler Inhale 2 puffs into the lungs every 6 (six) hours as needed for wheezing or shortness of breath. 1 Inhaler 0  . ALPRAZolam (XANAX) 0.5 MG tablet TAKE 1 TABLET BY MOUTH ONCE DAILY AS NEEDED FOR ANXIETY. 24 tablet 0  . Cefprozil (CEFZIL PO) Take by mouth. Takes 10ml by mouth twice daily    . Melatonin 1 MG CAPS Take 1-18 mg by mouth at bedtime as needed (sleep).     . triamterene-hydrochlorothiazide (DYAZIDE) 37.5-25 MG capsule Take one capsule up to TID per week prn swelling 30 capsule 2  . triamterene-hydrochlorothiazide (MAXZIDE-25) 37.5-25 MG tablet  TAKE ONE TABLET BY MOUTH DAILY. 30 tablet 0   No current facility-administered medications for this visit.     Allergies as of 10/13/2018 - Review Complete 10/13/2018  Allergen Reaction Noted  . Phentermine Swelling 10/25/2014  . Codeine Itching   . Morphine Itching   . Other Itching 03/22/2014  . Tramadol Swelling 04/30/2014    Family History  Problem Relation Age of Onset  . Diabetes Mother        Borderline  . Hyperlipidemia Mother   . Stroke Mother   . Heart disease Mother   . COPD Mother   . Cancer Mother [id number]       kidney  . CAD Mother   .  Stroke Father   . Hyperlipidemia Father   . Heart disease Father   . COPD Father   . Hypertension Father   . Colon polyps Father        over age 52  . Heart disease Other   . Arthritis Other   . Lung disease Other   . Cancer Other   . Asthma Other   . Diabetes Other   . Kidney disease Other   . Crohn's disease Sister   . Colon cancer Maternal Grandfather   . Lung cancer Other        grandmother  . Crohn's disease Other        nephew  . Colitis Daughter     Social History   Socioeconomic History  . Marital status: Divorced    Spouse name: Not on file  . Number of children: Not on file  . Years of education: college  . Highest education level: Not on file  Occupational History  . Occupation: Radio broadcast assistantcafeteria assistant  Social Needs  . Financial resource strain: Not on file  . Food insecurity    Worry: Not on file    Inability: Not on file  . Transportation needs    Medical: Not on file    Non-medical: Not on file  Tobacco Use  . Smoking status: Never Smoker  . Smokeless tobacco: Never Used  Substance and Sexual Activity  . Alcohol use: Yes    Alcohol/week: 0.0 standard drinks    Comment: very rare social  . Drug use: No  . Sexual activity: Yes    Partners: Male    Birth control/protection: Surgical  Lifestyle  . Physical activity    Days per week: Not on file    Minutes per session: Not on file  . Stress: Not on file  Relationships  . Social Musicianconnections    Talks on phone: Not on file    Gets together: Not on file    Attends religious service: Not on file    Active member of club or organization: Not on file    Attends meetings of clubs or organizations: Not on file    Relationship status: Not on file  . Intimate partner violence    Fear of current or ex partner: Not on file    Emotionally abused: Not on file    Physically abused: Not on file    Forced sexual activity: Not on file  Other Topics Concern  . Not on file  Social History Narrative  . Not on  file    Review of Systems: Gen: Denies any fever, chills, fatigue, weight loss, lack of appetite.  CV: Denies chest pain, heart palpitations, peripheral edema, syncope.  Resp: Denies shortness of breath at rest or with exertion. Denies wheezing or cough.  GI: see HPI GU : Denies urinary burning, urinary frequency, urinary hesitancy MS: Denies joint pain, muscle weakness, cramps, or limitation of movement.  Derm: Denies rash, itching, dry skin Psych: Denies depression, anxiety, memory loss, and confusion Heme: see HPI  Physical Exam: BP (!) 148/95   Pulse 100   Temp (!) 97.1 F (36.2 C) (Temporal)   Ht 5\' 4"  (1.626 m)   Wt 231 lb 6.4 oz (105 kg)   BMI 39.72 kg/m  General:   Alert and oriented. Pleasant and cooperative. Well-nourished and well-developed.  Head:  Normocephalic and atraumatic. Eyes:  Without icterus, sclera clear and conjunctiva pink.  Ears:  Normal auditory acuity. Nose:  No deformity, discharge,  or lesions. Mouth:  No deformity or lesions, oral mucosa pink.  Lungs:  Clear to auscultation bilaterally.  Heart:  S1, S2 present without murmurs appreciated.  Abdomen:  +BS, soft, TTP LLQ and non-distended. No HSM noted. No guarding or rebound. No masses appreciated.  Rectal:  Deferred  Msk:  Symmetrical without gross deformities. Normal posture. Extremities:  Without edema. Neurologic:  Alert and  oriented x4 Psych:  Alert and cooperative. Normal mood and affect.

## 2018-10-13 NOTE — Patient Instructions (Signed)
We have scheduled you for a CT scan this week.   You may need a colonoscopy.   Start taking Benefiber daily. Start with 1 teaspoon and work up to 3 teaspoons a day. It's important to avoid straining, limit toilet time to 2-3 minutes.  Further recommendations to follow!   It was a pleasure to see you today. I want to create trusting relationships with patients to provide genuine, compassionate, and quality care. I value your feedback. If you receive a survey regarding your visit,  I greatly appreciate you taking time to fill this out.   Annitta Needs, PhD, ANP-BC [facility]

## 2018-10-14 NOTE — Assessment & Plan Note (Signed)
CT as planned.

## 2018-10-14 NOTE — Assessment & Plan Note (Signed)
53 year old female with chronic, intermittent low-volume hematochezia and now presenting with reportedly large amounts for the past 2 weeks, associated rectal discomfort and itching. Rectal bleeding tapered off and rectal symptoms improved. Associated with LLQ pain and occasional RLQ pain. Declined rectal exam today and blood work but tells me she gave blood last week and was told her Hgb was 14. Tender on exam and subjective fevers over the past 2 weeks but afebrile today. Ultimately recommending colonoscopy as last was in 2016, and she may be a good candidate for hemorrhoid banding; however, with associated pain, reported fevers, will pursue CT abd/pelvis this week. As of note, she is hesitant to pursue colonoscopy or other measures other than imaging due to finances.

## 2018-10-15 NOTE — Progress Notes (Signed)
cc'ed to pcp °

## 2018-10-16 LAB — BUN+CREAT
BUN/Creatinine Ratio: 16 (ref 9–23)
BUN: 12 mg/dL (ref 6–24)
Creatinine, Ser: 0.75 mg/dL (ref 0.57–1.00)
GFR calc Af Amer: 106 mL/min/{1.73_m2} (ref >59)
GFR calc non Af Amer: 92 mL/min/{1.73_m2} (ref >59)

## 2018-10-17 ENCOUNTER — Other Ambulatory Visit: Payer: Self-pay

## 2018-10-17 ENCOUNTER — Ambulatory Visit (HOSPITAL_COMMUNITY)
Admission: RE | Admit: 2018-10-17 | Discharge: 2018-10-17 | Disposition: A | Discharge status: Home / Self Care | Payer: BC Managed Care – PPO | Source: Ambulatory Visit | Attending: Gastroenterology | Admitting: Gastroenterology

## 2018-10-17 DIAGNOSIS — R1032 Left lower quadrant pain: Secondary | ICD-10-CM | POA: Insufficient documentation

## 2018-10-17 MED ORDER — IOHEXOL 300 MG/ML  SOLN
100.0000 mL | Freq: Once | INTRAMUSCULAR | Status: AC | PRN
  Administered 2018-10-17: 100 mL via INTRAVENOUS

## 2018-10-20 NOTE — Progress Notes (Signed)
CT without acute findings. Is patient still bleeding? I feel likely hemorrhoid related. Any further abdominal pain?

## 2018-10-22 ENCOUNTER — Telehealth: Payer: Self-pay | Admitting: *Deleted

## 2018-10-22 ENCOUNTER — Telehealth: Payer: Self-pay | Admitting: Gastroenterology

## 2018-10-22 NOTE — Telephone Encounter (Signed)
Pt returned call. CT results given to pt. See result note for documentation.

## 2018-10-22 NOTE — Telephone Encounter (Signed)
Pt wants you to call her when you get a chance.  [phone].

## 2018-10-22 NOTE — Telephone Encounter (Signed)
Mackenzie Gray spoke to pt in CT results.

## 2018-10-22 NOTE — Telephone Encounter (Signed)
Pt was returning a call. She said she was off work now and could answer her phone. [phone]

## 2018-10-28 NOTE — Progress Notes (Signed)
Where is this pain? Are her bowels more regular? Any constipation or frequent loose stool?

## 2018-11-07 ENCOUNTER — Ambulatory Visit: Payer: BC Managed Care – PPO | Admitting: Gastroenterology

## 2018-12-05 ENCOUNTER — Telehealth: Payer: Self-pay | Admitting: Gastroenterology

## 2018-12-05 NOTE — Telephone Encounter (Signed)
Stacey: please arrange a follow-up visit in next 2-3 weeks for abdominal pain.

## 2018-12-08 ENCOUNTER — Encounter: Payer: Self-pay | Admitting: Internal Medicine

## 2018-12-08 NOTE — Telephone Encounter (Signed)
PATIENT SCHEDULED  °

## 2018-12-12 ENCOUNTER — Other Ambulatory Visit: Payer: Self-pay | Admitting: Family Medicine

## 2018-12-12 ENCOUNTER — Encounter: Payer: Self-pay | Admitting: Family Medicine

## 2018-12-12 MED ORDER — NEOMYCIN-POLYMYXIN-HC 3.5-10000-1 OT SOLN: 3.0000 [drp] | Freq: Four times a day (QID) | OTIC | 0 refills | Status: DC

## 2018-12-12 MED ORDER — AMOXICILLIN 500 MG PO TABS: 500.0000 mg | ORAL_TABLET | Freq: Three times a day (TID) | ORAL | 0 refills | Status: DC

## 2018-12-12 MED ORDER — TRIAMCINOLONE ACETONIDE 0.1 % EX CREA: 1.0000 "application " | TOPICAL_CREAM | Freq: Two times a day (BID) | CUTANEOUS | 4 refills | Status: DC

## 2018-12-14 ENCOUNTER — Encounter: Payer: Self-pay | Admitting: Family Medicine

## 2018-12-15 ENCOUNTER — Encounter: Payer: Self-pay | Admitting: Family Medicine

## 2018-12-16 ENCOUNTER — Other Ambulatory Visit: Payer: Self-pay

## 2018-12-16 DIAGNOSIS — Z20822 Contact with and (suspected) exposure to covid-19: Secondary | ICD-10-CM

## 2018-12-17 ENCOUNTER — Other Ambulatory Visit: Payer: Self-pay | Admitting: Family Medicine

## 2018-12-18 LAB — NOVEL CORONAVIRUS, NAA: SARS-CoV-2, NAA: NOT DETECTED

## 2018-12-30 ENCOUNTER — Encounter: Payer: Self-pay | Admitting: Family Medicine

## 2018-12-30 ENCOUNTER — Ambulatory Visit: Payer: BC Managed Care – PPO | Admitting: Gastroenterology

## 2018-12-30 ENCOUNTER — Other Ambulatory Visit: Payer: Self-pay

## 2018-12-30 ENCOUNTER — Ambulatory Visit (INDEPENDENT_AMBULATORY_CARE_PROVIDER_SITE_OTHER): Payer: BC Managed Care – PPO | Admitting: Family Medicine

## 2018-12-30 ENCOUNTER — Encounter: Payer: Self-pay | Admitting: Gastroenterology

## 2018-12-30 VITALS — BP 136/84 | HR 95 | Temp 97.1°F | Ht 64.0 in | Wt 232.8 lb

## 2018-12-30 DIAGNOSIS — H6690 Otitis media, unspecified, unspecified ear: Secondary | ICD-10-CM

## 2018-12-30 DIAGNOSIS — R1032 Left lower quadrant pain: Secondary | ICD-10-CM | POA: Diagnosis not present

## 2018-12-30 DIAGNOSIS — R112 Nausea with vomiting, unspecified: Secondary | ICD-10-CM

## 2018-12-30 DIAGNOSIS — R110 Nausea: Secondary | ICD-10-CM

## 2018-12-30 MED ORDER — ONDANSETRON 4 MG PO TBDP: ORAL_TABLET | ORAL | 0 refills | Status: DC

## 2018-12-30 MED ORDER — PANTOPRAZOLE SODIUM 40 MG PO TBEC: 40.0000 mg | DELAYED_RELEASE_TABLET | Freq: Every day | ORAL | 3 refills | Status: DC

## 2018-12-30 NOTE — Patient Instructions (Signed)
Let's try supplemental fiber daily, such as Metamucil or Benefiber. I included a handout that provides generic options for Benefiber. This will last you awhile. [employer] may have some good ideas that would be cost-effective.  On any given day you don't have a bowel movement, take a dose of Miralax. We may need to consider a prescription medication in the future to even things out. Hopefully, these measures will help  Please call in a few weeks with an update so we can change anything that is not working.  4-6 month return. Please call if any worsening of symptoms!  I enjoyed seeing you again today! As you know, I value our relationship and want to provide genuine, compassionate, and quality care. I welcome your feedback. If you receive a survey regarding your visit,  I greatly appreciate you taking time to fill this out. See you next time!  Annitta Needs, PhD, ANP-BC [facility]

## 2018-12-30 NOTE — Progress Notes (Signed)
Referring Provider: Mikey Kirschner, MD Primary Care Physician:  Mikey Kirschner, MD  Primary GI: Dr. Gala Romney  Chief Complaint  Patient presents with  . Abdominal Pain    LLQ, constant pain  . Rectal Bleeding    occas episodes  . Nausea    with vomiting one episode yesterday, mostly nausea  . requests doctors note    HPI:   Mackenzie Gray is a 53 y.o. female presenting today with a history of chronic, intermittent low-volume hematochezia, LLQ pain, occasional RLQ pain, declining rectal exam at last visit. Last colonoscopy in 2016. CT abd/pelvis was completed due to subjective fevers. Aug 2020 CT without acute findings, stable hepatic steatosis.   Eats fruit to help with constipation. No BM in 2 days. Sent out from work yesterday due to nausea and stomach cramps. Vomited yesterday in the bathroom at work. Otherwise stool consistency is more loose. Constipated about once to twice a week. Left sided pain better after a BM. Can tell when really hurts it is impacted. Feels swollen and tight. Will have days with a BM bristol stool scale #6/7 but very minimal. Days where it lets loose and she has loose stool. Never has a soft, formed stool. Will drink lemon juice to have BM.   Rectal bleeding: improved. Still with intermittent low-volume hematochezia.   Nausea: intermittent. Smell of food makes her nauseated. States slight fever in evening but then back to normal. Small amount of esophageal burning. Appetite not good. Not hungry most of the time. Protonix didn't help in past. Prilosec didn't help. Nexium doesn't help. Wants to do home remedies for now.   Left elbow pain, causes tremors down her arm. Difficult to lift things.   Does not want to pursue colonoscopy.    Past Medical History:  Diagnosis Date  . ADD (attention deficit disorder)    Adult  . Anxiety   . Bronchitis   . Depression   . Fatty liver    CT 2013  . GERD (gastroesophageal reflux disease)   . Hypertension   .  Hypoestrogenism   . IBS (irritable bowel syndrome)   . Migraines   . PONV (postoperative nausea and vomiting)     Past Surgical History:  Procedure Laterality Date  . ABDOMINAL HYSTERECTOMY    . CARPAL TUNNEL RELEASE Left 09/09/2014   Procedure: LEFT CARPAL TUNNEL RELEASE;  Surgeon: Leanora Cover, MD;  Location: [facility];  Service: Orthopedics;  Laterality: Left;  . COLONOSCOPY    . COLONOSCOPY  10/2007   RMR: normal TI, normal colon, internal hemorrhoids  . COLONOSCOPY N/A 02/04/2015   RMR: Grade 2-3 hemorrhoid likely source of hematochezia; otherwise normal ileo-colonoscopy.   . COLONOSCOPY WITH ESOPHAGOGASTRODUODENOSCOPY (EGD)  2004   RMR: prepyloric ulcer, negative bx, internal hemorrhoids  . disc removal 5-6 vert    . ESOPHAGOGASTRODUODENOSCOPY    . ESOPHAGOGASTRODUODENOSCOPY N/A 02/04/2015   RMR: Erosive reflux esophagitis  . SPINAL FUSION     X 2  . TOTAL ABDOMINAL HYSTERECTOMY W/ BILATERAL SALPINGOOPHORECTOMY Left   . TUBAL LIGATION      Current Outpatient Medications  Medication Sig Dispense Refill  . albuterol (PROVENTIL HFA;VENTOLIN HFA) 108 (90 Base) MCG/ACT inhaler Inhale 2 puffs into the lungs every 6 (six) hours as needed for wheezing or shortness of breath. 1 Inhaler 0  . ALPRAZolam (XANAX) 0.5 MG tablet TAKE 1 TABLET BY MOUTH ONCE DAILY AS NEEDED FOR ANXIETY. 24 tablet 0  . amoxicillin (AMOXIL) 500 MG  capsule TAKE 1 CAPSULE BY MOUTH THREE TIMES A DAY. 21 capsule 0  . Melatonin 1 MG CAPS Take 1-18 mg by mouth at bedtime as needed (sleep).     . neomycin-polymyxin-hydrocortisone (CORTISPORIN) OTIC solution Place 3 drops into both ears 4 (four) times daily. 10 mL 0  . triamcinolone cream (KENALOG) 0.1 % Apply 1 application topically 2 (two) times daily. (Patient taking differently: Apply 1 application topically as needed. ) 45 g 4  . ondansetron (ZOFRAN ODT) 4 MG disintegrating tablet Take one every 6 hours prn nausea 16 tablet 0   No current  facility-administered medications for this visit.     Allergies as of 12/30/2018 - Review Complete 12/30/2018  Allergen Reaction Noted  . Phentermine Swelling 10/25/2014  . Codeine Itching   . Morphine Itching   . Other Itching 03/22/2014  . Tramadol Swelling 04/30/2014    Family History  Problem Relation Age of Onset  . Diabetes Mother        Borderline  . Hyperlipidemia Mother   . Stroke Mother   . Heart disease Mother   . COPD Mother   . Cancer Mother 88       kidney  . CAD Mother   . Stroke Father   . Hyperlipidemia Father   . Heart disease Father   . COPD Father   . Hypertension Father   . Colon polyps Father        over age 69  . Heart disease Other   . Arthritis Other   . Lung disease Other   . Cancer Other   . Asthma Other   . Diabetes Other   . Kidney disease Other   . Crohn's disease Sister   . Colon cancer Maternal Grandfather   . Lung cancer Other        grandmother  . Crohn's disease Other        nephew  . Colitis Daughter     Social History   Socioeconomic History  . Marital status: Divorced    Spouse name: Not on file  . Number of children: Not on file  . Years of education: college  . Highest education level: Not on file  Occupational History  . Occupation: Radio broadcast assistant  Social Needs  . Financial resource strain: Not on file  . Food insecurity    Worry: Not on file    Inability: Not on file  . Transportation needs    Medical: Not on file    Non-medical: Not on file  Tobacco Use  . Smoking status: Never Smoker  . Smokeless tobacco: Never Used  Substance and Sexual Activity  . Alcohol use: Yes    Alcohol/week: 0.0 standard drinks    Comment: very rare social  . Drug use: No  . Sexual activity: Yes    Partners: Male    Birth control/protection: Surgical  Lifestyle  . Physical activity    Days per week: Not on file    Minutes per session: Not on file  . Stress: Not on file  Relationships  . Social Wellsite geologist on phone: Not on file    Gets together: Not on file    Attends religious service: Not on file    Active member of club or organization: Not on file    Attends meetings of clubs or organizations: Not on file    Relationship status: Not on file  Other Topics Concern  . Not on file  Social History Narrative  .  Not on file    Review of Systems: Gen: see HPI CV: Denies chest pain, palpitations, syncope, peripheral edema, and claudication. Resp: Denies dyspnea at rest, cough, wheezing, coughing up blood, and pleurisy. GI: see HPI Derm: Denies rash, itching, dry skin Psych: Denies depression, anxiety, memory loss, confusion. No homicidal or suicidal ideation.  Heme: Denies bruising, bleeding, and enlarged lymph nodes.  Physical Exam: BP 136/84   Pulse 95   Temp (!) 97.1 F (36.2 C) (Oral)   Ht 5\' 4"  (1.626 m)   Wt 232 lb 12.8 oz (105.6 kg)   BMI 39.96 kg/m  General:   Alert and oriented. No distress noted. Pleasant and cooperative.  Head:  Normocephalic and atraumatic. Abdomen:  +BS, soft, mild TTP LLQ/RLQ, distended bust soft. No rebound or guarding. No HSM or masses noted. Msk:  Symmetrical without gross deformities. Normal posture. Extremities:  Without edema. Neurologic:  Alert and  oriented x4 Psych:  Alert and cooperative. Normal mood and affect.  ASSESSMENT/PLAN:  53 year old female with alternating bowel habits but from description seems to be dealing with more underlying constipation, unproductive stools, not amenable to prescriptive agents at this time. Rectal bleeding likely benign source in setting of constipation; however, I have recommended colonoscopy as last was in 2016. She has declined this on two occasions, as finances are tight.   GERD: declining PPI and requesting to trial home remedies.   Overall, patient does not want to pursue further tests or medications primarily due to cost. Discussed adding supplemental fiber, Miralax on any given day no BM. Call  if she is willing to try another PPI. We will see her back in 4-6 months or sooner if needed.  Gelene MinkAnna W. Jamell Opfer, PhD, ANP-BC [facility]

## 2018-12-30 NOTE — Progress Notes (Signed)
   Subjective:    Patient ID: Billiejean Schimek, female    DOB: 05-Jul-1965, 53 y.o.   MRN: [id number]  Cough This is a new problem. Associated symptoms comments: Vomiting, runny nose, smell of food made her sick at work yesterday (pt is needing a work note for yesterday). Pt states that ears are still infected and that is causing her to run a low grade fever at night. .   Pt is on amoxicillin and ear drops. Pt has been having trouble swallowing pills. Pt states that her left ear is very muffled.   Virtual Visit via Telephone Note  I connected with Vonna Brabson on 12/30/18 at  3:00 PM EDT by telephone and verified that I am speaking with the correct person using two identifiers.  Location: Patient: home Provider: office   I discussed the limitations, risks, security and privacy concerns of performing an evaluation and management service by telephone and the availability of in person appointments. I also discussed with the patient that there may be a patient responsible charge related to this service. The patient expressed understanding and agreed to proceed.   History of Present Illness:    Observations/Objective:   Assessment and Plan:   Follow Up Instructions:    I discussed the assessment and treatment plan with the patient. The patient was provided an opportunity to ask questions and all were answered. The patient agreed with the plan and demonstrated an understanding of the instructions.   The patient was advised to call back or seek an in-person evaluation if the symptoms worsen or if the condition fails to improve as anticipated.  I provided 18 minutes of non-face-to-face time during this encounter.   Vicente Males, LPN  Patient notes congestion.  Has settled into her ears.  Very painful ear.  Just started amoxicillin yes.  Slight transient nausea with this.  See GI note from earlier today  Review of Systems  Respiratory: Positive for cough.        Objective:   Physical  Exam Virtual       Assessment & Plan:  Impression nasal congestion is secondary otitis media.  Patient very resistant to COVID-19 testing.  States she is already done this several times and each time it was negative.  Patient to start her course of amoxicillin is initiated.  Recommend take full course.  If progressed onto further symptoms please notify us and we would definitely [place] at that point to do the Covid testing again

## 2019-01-05 NOTE — Progress Notes (Signed)
cc'ed to pcp °

## 2019-01-12 ENCOUNTER — Encounter: Payer: Self-pay | Admitting: Family Medicine

## 2019-01-12 ENCOUNTER — Other Ambulatory Visit: Payer: Self-pay | Admitting: Family Medicine

## 2019-01-13 ENCOUNTER — Other Ambulatory Visit: Payer: Self-pay

## 2019-01-13 MED ORDER — ALBUTEROL SULFATE HFA 108 (90 BASE) MCG/ACT IN AERS: 2.0000 | INHALATION_SPRAY | Freq: Four times a day (QID) | RESPIRATORY_TRACT | 3 refills | Status: DC | PRN

## 2019-01-20 ENCOUNTER — Other Ambulatory Visit: Payer: Self-pay | Admitting: *Deleted

## 2019-01-20 DIAGNOSIS — Z20822 Contact with and (suspected) exposure to covid-19: Secondary | ICD-10-CM

## 2019-01-26 LAB — NOVEL CORONAVIRUS, NAA: SARS-CoV-2, NAA: NOT DETECTED

## 2019-02-02 ENCOUNTER — Encounter: Payer: Self-pay | Admitting: Family Medicine

## 2019-02-03 ENCOUNTER — Ambulatory Visit (INDEPENDENT_AMBULATORY_CARE_PROVIDER_SITE_OTHER): Payer: BC Managed Care – PPO | Admitting: Family Medicine

## 2019-02-03 ENCOUNTER — Other Ambulatory Visit: Payer: Self-pay

## 2019-02-03 ENCOUNTER — Encounter: Payer: Self-pay | Admitting: Family Medicine

## 2019-02-03 DIAGNOSIS — H60503 Unspecified acute noninfective otitis externa, bilateral: Secondary | ICD-10-CM | POA: Diagnosis not present

## 2019-02-03 MED ORDER — CEFDINIR 250 MG/5ML PO SUSR: ORAL | 0 refills | Status: DC

## 2019-02-03 MED ORDER — NEOMYCIN-POLYMYXIN-HC 3.5-10000-1 OT SOLN: 3.0000 [drp] | Freq: Four times a day (QID) | OTIC | 0 refills | Status: DC

## 2019-02-03 MED ORDER — ALBUTEROL SULFATE HFA 108 (90 BASE) MCG/ACT IN AERS: 2.0000 | INHALATION_SPRAY | Freq: Four times a day (QID) | RESPIRATORY_TRACT | 0 refills | Status: DC | PRN

## 2019-02-03 NOTE — Progress Notes (Signed)
   Subjective:  Audio only  Patient ID: Mackenzie Gray, female    DOB: 28-Jun-1965, 53 y.o.   MRN: [id number]  Otalgia  There is pain in both ears. This is a new problem. The current episode started 1 to 4 weeks ago. Associated symptoms include coughing, headaches and rhinorrhea. Associated symptoms comments: congestion.   Nasal congestion and drainage Finished antibiotic and didn't help- covid negative on November 17 Needs refill of inhaler   Review of Systems  HENT: Positive for ear pain and rhinorrhea.   Respiratory: Positive for cough.   Neurological: Positive for headaches.   Virtual Visit via Video Note  I connected with Ryley Teater on 02/03/19 at  9:30 AM EST by a video enabled telemedicine application and verified that I am speaking with the correct person using two identifiers.  Location: Patient: home Provider: office   I discussed the limitations of evaluation and management by telemedicine and the availability of in person appointments. The patient expressed understanding and agreed to proceed.  History of Present Illness:    Observations/Objective:   Assessment and Plan:   Follow Up Instructions:    I discussed the assessment and treatment plan with the patient. The patient was provided an opportunity to ask questions and all were answered. The patient agreed with the plan and demonstrated an understanding of the instructions.   The patient was advised to call back or seek an in-person evaluation if the symptoms worsen or if the condition fails to improve as anticipated. 18 minutes of non-face-to-face time during this encounter.        Objective:   Physical Exam Virtual       Assessment & Plan:  Impression recurrent otitis media.  Potential element of external otitis.  Antibiotics prescribed.  Drops prescribed.  Discussion held time for ENT referral we will press on with this

## 2019-02-08 ENCOUNTER — Encounter: Payer: Self-pay | Admitting: Family Medicine

## 2019-02-10 ENCOUNTER — Encounter: Payer: Self-pay | Admitting: Family Medicine

## 2019-02-19 ENCOUNTER — Telehealth: Payer: Self-pay | Admitting: Family Medicine

## 2019-02-19 MED ORDER — AMOXICILLIN 400 MG/5ML PO SUSR: ORAL | 0 refills | Status: DC

## 2019-02-19 NOTE — Telephone Encounter (Signed)
Pt seen 02/03/2019 for ear pain. Please advise. Thank you

## 2019-02-19 NOTE — Telephone Encounter (Signed)
Pt contacted and states that she does better with liquid meds. Amoxicillin sent to pharmacy with directions to deliver.   Pt is wanting to know if provider will send in a prescription for a nebulizer due to her having more asthma attacks. Please advise. Thank you

## 2019-02-19 NOTE — Telephone Encounter (Signed)
At that time liquid was ordered?  Does patient only need liquid antibiotic?  If still may try amoxicillin 400 mg per 5 mL, 10 mL twice daily for 10 days If ongoing troubles next that would be office visit to look in the ears  If patient prefers pill I would recommend amoxicillin 500 mg 1 taken 3 times daily for 10 days

## 2019-02-19 NOTE — Telephone Encounter (Signed)
Patient states finished up antibiotics and both ears are still hurting.[facility] to deliver. please advise

## 2019-02-20 ENCOUNTER — Other Ambulatory Visit: Payer: Self-pay | Admitting: Family Medicine

## 2019-02-20 MED ORDER — ALBUTEROL SULFATE HFA 108 (90 BASE) MCG/ACT IN AERS: 2.0000 | INHALATION_SPRAY | Freq: Four times a day (QID) | RESPIRATORY_TRACT | 0 refills | Status: DC | PRN

## 2019-02-20 NOTE — Telephone Encounter (Signed)
Inhaler sent to pharmacy. Left message to return call

## 2019-02-20 NOTE — Telephone Encounter (Signed)
Let pt know in a functioning normal adult nebs are shown not to be any better than sprays, more importantly may need preventi9on intervention. How often wheezing every wek enoughto need mdi? How often having flares?

## 2019-02-20 NOTE — Telephone Encounter (Signed)
Prescribe extra inhaler to have on hand, inhaler wont help phlegm in throat, use rob dm or dimetapp

## 2019-02-20 NOTE — Telephone Encounter (Signed)
Pt has been using inhaler BID about twice a week. Pt is having to walk back and forth in the cold air. Pt does not have a spare inhaler. Having flares twice a day a couple times a week. Pt gets short of breath and is made to sit down by coworkers. 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 Pt states that she has not been sleeping well for the past 3 nights, she wakes up on the hour every hour and is choking from phlegm in her throat. Please advise. Thank you

## 2019-02-23 ENCOUNTER — Other Ambulatory Visit: Payer: Self-pay

## 2019-02-23 ENCOUNTER — Ambulatory Visit: Payer: BC Managed Care – PPO | Attending: Internal Medicine

## 2019-02-23 DIAGNOSIS — Z20822 Contact with and (suspected) exposure to covid-19: Secondary | ICD-10-CM

## 2019-02-24 LAB — NOVEL CORONAVIRUS, NAA: SARS-CoV-2, NAA: NOT DETECTED

## 2019-02-25 NOTE — Telephone Encounter (Signed)
Contacted pharmacy. Pt has picked up medications.

## 2019-03-11 ENCOUNTER — Other Ambulatory Visit: Payer: Self-pay

## 2019-03-11 ENCOUNTER — Ambulatory Visit (INDEPENDENT_AMBULATORY_CARE_PROVIDER_SITE_OTHER): Payer: BC Managed Care – PPO | Admitting: Family Medicine

## 2019-03-11 ENCOUNTER — Encounter: Payer: Self-pay | Admitting: Family Medicine

## 2019-03-11 VITALS — BP 138/90 | Temp 97.4°F | Ht 64.0 in | Wt 230.0 lb

## 2019-03-11 DIAGNOSIS — H6022 Malignant otitis externa, left ear: Secondary | ICD-10-CM

## 2019-03-11 MED ORDER — CEFDINIR 250 MG/5ML PO SUSR: ORAL | 0 refills | Status: DC

## 2019-03-11 MED ORDER — CIPROFLOXACIN-DEXAMETHASONE 0.3-0.1 % OT SUSP: 4.0000 [drp] | Freq: Two times a day (BID) | OTIC | 0 refills | Status: DC

## 2019-03-11 NOTE — Telephone Encounter (Signed)
Pt contacted office and has appt today at 4:10

## 2019-03-11 NOTE — Progress Notes (Signed)
   Subjective:    Patient ID: Mackenzie Gray, female    DOB: 1965-11-05, 54 y.o.   MRN: [id number]  Otalgia  There is pain in the left ear. This is a new problem. The current episode started today. There has been no fever. Associated symptoms comments: Left side of face swelling. She has tried acetaminophen (swimmers ear drops, sweet oil, cortisporin) for the symptoms.  No cough no chest pain no shortness of breath no sore throat    Review of Systems  HENT: Positive for ear pain.        Objective:   Physical Exam Alert vitals stable, NAD. Blood pressure good on repeat. HEENT normal. Lungs clear. Heart regular rate and rhythm. Positive left preauricular tender lymph node positive swollen ear slight crustiness       Assessment & Plan:  Impression external otitis fairly severe with elements of preauricular lymphadenitis.  Eardrops prescribed.  Antibiotics prescribed.  Symptom care discussed questions answered with

## 2019-03-20 ENCOUNTER — Other Ambulatory Visit: Payer: Self-pay

## 2019-03-20 DIAGNOSIS — Z20822 Contact with and (suspected) exposure to covid-19: Secondary | ICD-10-CM

## 2019-03-21 LAB — NOVEL CORONAVIRUS, NAA: SARS-CoV-2, NAA: NOT DETECTED

## 2019-04-08 ENCOUNTER — Encounter: Payer: Self-pay | Admitting: Family Medicine

## 2019-04-08 ENCOUNTER — Encounter (INDEPENDENT_AMBULATORY_CARE_PROVIDER_SITE_OTHER): Payer: Self-pay

## 2019-04-15 ENCOUNTER — Encounter: Payer: Self-pay | Admitting: Family Medicine

## 2019-04-15 NOTE — Telephone Encounter (Signed)
Pt states she thinks it might be her weight. She feels tired and run down for 2 days, has headache above her eyes. Chest pain only when wearing her mask. Frequent urination for 2 days and odor to urine. States she feels like this when she has UTI. Goes for covid test once a month and it is due. Pt states her ear infection is also back but she has some drops so she is not concerned with that. Thinks b12 injection would also help her. Advised her to go to ED if chest pain returns or if having sob. Pt verbalized understanding. Told her she could go to urgent care this evening since they are open til 8pm. She declined. She wanted appt for tomorrow. Noone here to schedule. Advised pt I would call her back first thing in the morning to have her scheduled.

## 2019-04-16 ENCOUNTER — Other Ambulatory Visit: Payer: Self-pay

## 2019-04-16 ENCOUNTER — Ambulatory Visit (INDEPENDENT_AMBULATORY_CARE_PROVIDER_SITE_OTHER): Payer: BC Managed Care – PPO | Admitting: Family Medicine

## 2019-04-16 DIAGNOSIS — Z1329 Encounter for screening for other suspected endocrine disorder: Secondary | ICD-10-CM | POA: Diagnosis not present

## 2019-04-16 DIAGNOSIS — R5383 Other fatigue: Secondary | ICD-10-CM

## 2019-04-16 DIAGNOSIS — Z79899 Other long term (current) drug therapy: Secondary | ICD-10-CM

## 2019-04-16 MED ORDER — CEFDINIR 300 MG PO CAPS: ORAL_CAPSULE | ORAL | 0 refills | Status: DC

## 2019-04-16 NOTE — Telephone Encounter (Signed)
Please call and give virtual appt this morning

## 2019-04-16 NOTE — Progress Notes (Signed)
   Subjective:  Audio  Patient ID: Mackenzie Gray, female    DOB: 03-Nov-1965, 54 y.o.   MRN: [id number]  HPI Pt sent MyChart message yesterday. Pt states that she is feeling run down, tired, has a headache, nausea, ear infection (pt has been using ear drop that she had prescribed before) and UTI symptoms (frequent urination and odor for 2 days). Pt has not tried any treatments at this time.   Virtual Visit via Video Note  I connected with Mackenzie Gray on 04/16/19 at 10:30 AM EST by a video enabled telemedicine application and verified that I am speaking with the correct person using two identifiers.  Location: Patient: home Provider: office   I discussed the limitations of evaluation and management by telemedicine and the availability of in person appointments. The patient expressed understanding and agreed to proceed.  History of Present Illness:    Observations/Objective:   Assessment and Plan:   Follow Up Instructions:    I discussed the assessment and treatment plan with the patient. The patient was provided an opportunity to ask questions and all were answered. The patient agreed with the plan and demonstrated an understanding of the instructions.   The patient was advised to call back or seek an in-person evaluation if the symptoms worsen or if the condition fails to improve as anticipated.  I provided 20 minutes of non-face-to-face time during this encounter.   Patient notes dysuria.  No increased urinary frequency.  Several days duration.  No fever or chills.  Also notes some nasal congestion sinus congestion and discomfort into her ears.  No cough no shortness of breath  Review of Systems See above    Objective:   Physical Exam  Virtual      Assessment & Plan:  Impression rhinosinusitis/UTI.  Antibiotics prescribed symptom care discussed warning signs discussed.  Patient notes substantial fatigue.  Longstanding.  No recent blood work.  We will do blood work  to assess this

## 2019-04-21 ENCOUNTER — Encounter: Payer: Self-pay | Admitting: Family Medicine

## 2019-04-28 ENCOUNTER — Other Ambulatory Visit: Payer: Self-pay | Admitting: *Deleted

## 2019-04-28 ENCOUNTER — Encounter: Payer: Self-pay | Admitting: Family Medicine

## 2019-04-28 MED ORDER — MECLIZINE HCL 25 MG PO TABS: 25.0000 mg | ORAL_TABLET | Freq: Three times a day (TID) | ORAL | 0 refills | Status: DC | PRN

## 2019-04-28 NOTE — Telephone Encounter (Signed)
Pt states she was suppose to let dr Brett Canales know if headaches and dizziness are not better. She states it about the same. Started about one and a half weeks ago about the same time as her ear infection started. Headaches and dizziness are constant. No fever. Not taking anything for the headache. Ear drops have helped and ear is better.

## 2019-05-01 ENCOUNTER — Other Ambulatory Visit: Payer: Self-pay

## 2019-05-01 ENCOUNTER — Ambulatory Visit: Payer: BC Managed Care – PPO | Attending: Internal Medicine

## 2019-05-01 DIAGNOSIS — Z20822 Contact with and (suspected) exposure to covid-19: Secondary | ICD-10-CM

## 2019-05-02 ENCOUNTER — Encounter: Payer: Self-pay | Admitting: Family Medicine

## 2019-05-02 LAB — CBC WITH DIFFERENTIAL/PLATELET
Basophils Absolute: 0.1 10*3/uL (ref 0.0–0.2)
Basos: 1 %
EOS (ABSOLUTE): 0.2 10*3/uL (ref 0.0–0.4)
Eos: 3 %
Hematocrit: 43.7 % (ref 34.0–46.6)
Hemoglobin: 14.6 g/dL (ref 11.1–15.9)
Immature Grans (Abs): 0 10*3/uL (ref 0.0–0.1)
Immature Granulocytes: 0 %
Lymphocytes Absolute: 1.9 10*3/uL (ref 0.7–3.1)
Lymphs: 31 %
MCH: 29.8 pg (ref 26.6–33.0)
MCHC: 33.4 g/dL (ref 31.5–35.7)
MCV: 89 fL (ref 79–97)
Monocytes Absolute: 0.4 10*3/uL (ref 0.1–0.9)
Monocytes: 6 %
Neutrophils Absolute: 3.7 10*3/uL (ref 1.4–7.0)
Neutrophils: 59 %
Platelets: 281 10*3/uL (ref 150–450)
RBC: 4.9 x10E6/uL (ref 3.77–5.28)
RDW: 13 % (ref 11.7–15.4)
WBC: 6.3 10*3/uL (ref 3.4–10.8)

## 2019-05-02 LAB — LIPID PANEL
Chol/HDL Ratio: 3.6 ratio (ref 0.0–4.4)
Cholesterol, Total: 200 mg/dL — ABNORMAL HIGH (ref 100–199)
HDL: 55 mg/dL (ref >39)
LDL Chol Calc (NIH): 117 mg/dL — ABNORMAL HIGH (ref 0–99)
Triglycerides: 162 mg/dL — ABNORMAL HIGH (ref 0–149)
VLDL Cholesterol Cal: 28 mg/dL (ref 5–40)

## 2019-05-02 LAB — HEPATIC FUNCTION PANEL
ALT: 31 IU/L (ref 0–32)
AST: 20 IU/L (ref 0–40)
Albumin: 4.4 g/dL (ref 3.8–4.9)
Alkaline Phosphatase: 93 IU/L (ref 39–117)
Bilirubin Total: 0.4 mg/dL (ref 0.0–1.2)
Bilirubin, Direct: 0.12 mg/dL (ref 0.00–0.40)
Total Protein: 7.2 g/dL (ref 6.0–8.5)

## 2019-05-02 LAB — TSH: TSH: 1.04 u[IU]/mL (ref 0.450–4.500)

## 2019-05-02 LAB — VITAMIN B12: Vitamin B-12: 612 pg/mL (ref 232–1245)

## 2019-05-02 LAB — BASIC METABOLIC PANEL
BUN/Creatinine Ratio: 13 (ref 9–23)
BUN: 10 mg/dL (ref 6–24)
CO2: 24 mmol/L (ref 20–29)
Calcium: 9.4 mg/dL (ref 8.7–10.2)
Chloride: 103 mmol/L (ref 96–106)
Creatinine, Ser: 0.8 mg/dL (ref 0.57–1.00)
GFR calc Af Amer: 98 mL/min/{1.73_m2} (ref >59)
GFR calc non Af Amer: 85 mL/min/{1.73_m2} (ref >59)
Glucose: 91 mg/dL (ref 65–99)
Potassium: 4.7 mmol/L (ref 3.5–5.2)
Sodium: 140 mmol/L (ref 134–144)

## 2019-05-03 ENCOUNTER — Encounter: Payer: Self-pay | Admitting: Family Medicine

## 2019-05-04 LAB — NOVEL CORONAVIRUS, NAA

## 2019-05-04 NOTE — Telephone Encounter (Signed)
Merlyn Albert, MD     These three messages are a bit ? Read letter I wrote to pt if these three messages are about the labs and send the letter not sure why not sent

## 2019-05-05 ENCOUNTER — Ambulatory Visit: Payer: BC Managed Care – PPO | Attending: Internal Medicine

## 2019-05-05 ENCOUNTER — Other Ambulatory Visit: Payer: Self-pay

## 2019-05-05 DIAGNOSIS — Z20822 Contact with and (suspected) exposure to covid-19: Secondary | ICD-10-CM

## 2019-05-06 LAB — NOVEL CORONAVIRUS, NAA: SARS-CoV-2, NAA: NOT DETECTED

## 2019-05-10 ENCOUNTER — Ambulatory Visit: Payer: BC Managed Care – PPO

## 2019-07-06 ENCOUNTER — Other Ambulatory Visit: Payer: BC Managed Care – PPO

## 2019-07-06 ENCOUNTER — Other Ambulatory Visit: Payer: Self-pay

## 2019-07-06 ENCOUNTER — Ambulatory Visit: Payer: BC Managed Care – PPO | Attending: Internal Medicine

## 2019-07-06 DIAGNOSIS — Z20822 Contact with and (suspected) exposure to covid-19: Secondary | ICD-10-CM

## 2019-07-07 ENCOUNTER — Encounter: Payer: Self-pay | Admitting: Family Medicine

## 2019-07-07 LAB — NOVEL CORONAVIRUS, NAA: SARS-CoV-2, NAA: NOT DETECTED

## 2019-07-07 LAB — SARS-COV-2, NAA 2 DAY TAT

## 2019-07-13 ENCOUNTER — Other Ambulatory Visit: Payer: Self-pay | Admitting: Family Medicine

## 2019-07-13 MED ORDER — ALBUTEROL SULFATE HFA 108 (90 BASE) MCG/ACT IN AERS: 2.0000 | INHALATION_SPRAY | Freq: Four times a day (QID) | RESPIRATORY_TRACT | 3 refills | Status: DC | PRN

## 2019-07-22 ENCOUNTER — Encounter: Payer: Self-pay | Admitting: Family Medicine

## 2019-07-22 ENCOUNTER — Other Ambulatory Visit: Payer: Self-pay | Admitting: *Deleted

## 2019-07-22 MED ORDER — ESTRADIOL 2 MG PO TABS: 2.0000 mg | ORAL_TABLET | Freq: Every day | ORAL | 0 refills | Status: DC

## 2019-07-22 NOTE — Telephone Encounter (Signed)
Went back to 2018 Could not find last med check up or visit discussing this med

## 2019-07-29 ENCOUNTER — Ambulatory Visit: Payer: BC Managed Care – PPO | Admitting: Family Medicine

## 2019-07-29 ENCOUNTER — Telehealth: Payer: Self-pay | Admitting: Nurse Practitioner

## 2019-07-29 ENCOUNTER — Other Ambulatory Visit: Payer: Self-pay

## 2019-07-29 VITALS — BP 122/74 | Temp 97.9°F | Wt 227.8 lb

## 2019-07-29 DIAGNOSIS — R21 Rash and other nonspecific skin eruption: Secondary | ICD-10-CM

## 2019-07-29 MED ORDER — MUPIROCIN 2 % EX OINT: 1.0000 "application " | TOPICAL_OINTMENT | Freq: Two times a day (BID) | CUTANEOUS | 0 refills | Status: DC

## 2019-07-29 MED ORDER — PANTOPRAZOLE SODIUM 40 MG PO TBEC
40.0000 mg | DELAYED_RELEASE_TABLET | Freq: Every day | ORAL | 3 refills | Status: DC
Start: 2019-07-29 — End: 2020-11-30

## 2019-07-29 MED ORDER — DOXYCYCLINE HYCLATE 100 MG PO TABS
100.0000 mg | ORAL_TABLET | Freq: Two times a day (BID) | ORAL | 0 refills | Status: DC
Start: 2019-07-29 — End: 2020-01-15

## 2019-07-29 NOTE — Telephone Encounter (Signed)
Patient has physical on 6/9 and needing labs.

## 2019-07-29 NOTE — Telephone Encounter (Signed)
Last labs 05/01/19 lipid, liver, bmp, cbc, b12, tsh

## 2019-07-29 NOTE — Progress Notes (Signed)
   Subjective:    Patient ID: Mackenzie Gray, female    DOB: 09-Aug-1965, 54 y.o.   MRN: [id number]  HPI Patient comes in today after waking up with her left arm red and swollen from what she believes was a bug bite yesterday and today has burnt herself in the same spot. Patient reports some Draining and blistering.   Review of Systems No headache, no major weight loss or weight gain, no chest pain no back pain abdominal pain no change in bowel habits complete ROS otherwise negative     Objective:   Physical Exam  Alert vitals stable, NAD. Blood pressure good on repeat. HEENT normal. Lungs clear. Heart regular rate and rhythm.  Left arm secondarily infected bug bit with super immposed burn, some disch and erythema      Assessment & Plan:  Early cellulitis upon  A bite, abx rxed, wound care disc, w s s discussed

## 2019-07-31 NOTE — Telephone Encounter (Signed)
Pt returned call and verbalized understanding. Pt is wanting to have her sugar checked. Informed patient that we can check A1C when she comes in for physical. Pt verbalized understanding.

## 2019-07-31 NOTE — Telephone Encounter (Signed)
Just reviewed her labs from February. Nothing due at this time. We can discuss previous labs more at her physical. Thanks.

## 2019-07-31 NOTE — Telephone Encounter (Signed)
Lmtc

## 2019-08-12 ENCOUNTER — Encounter: Payer: Self-pay | Admitting: Nurse Practitioner

## 2019-08-12 ENCOUNTER — Other Ambulatory Visit: Payer: Self-pay

## 2019-08-12 ENCOUNTER — Ambulatory Visit: Payer: BC Managed Care – PPO | Admitting: Nurse Practitioner

## 2019-08-12 VITALS — BP 126/78 | Temp 98.4°F | Ht 63.5 in | Wt 227.4 lb

## 2019-08-12 DIAGNOSIS — Z131 Encounter for screening for diabetes mellitus: Secondary | ICD-10-CM | POA: Diagnosis not present

## 2019-08-12 DIAGNOSIS — Z01419 Encounter for gynecological examination (general) (routine) without abnormal findings: Secondary | ICD-10-CM | POA: Diagnosis not present

## 2019-08-12 LAB — POCT GLYCOSYLATED HEMOGLOBIN (HGB A1C): Hemoglobin A1C: 5.5 % (ref 4.0–5.6)

## 2019-08-12 NOTE — Progress Notes (Signed)
Subjective:    Patient ID: Mackenzie Gray, female    DOB: 12/13/1965, 54 y.o.   MRN: [id number]  HPI  Presents for wellness visit. Requested A1C for she states that she has had ongoing concerns of bilateral ankle edema and sister was recently diagnosed with DM.  Vaccinations and COVID up to date. States she is not currently adhering to diet or exercise plan and states she is on her feet long hours while working in [employer]. Will schedule mammogram at a later date related to recent COVID vaccination. Denies tobacco use and states she consumes alcohol socially. States she is sexually active with the same sex partner of 14 years. Partial hysterectomy with no cocerns of bleeding or discharge. Has been off Estrace for 2-3 months.   Review of Systems  Constitutional: Negative for activity change, appetite change, fatigue, fever and unexpected weight change.  Respiratory: Negative for cough, chest tightness, shortness of breath and wheezing.   Cardiovascular: Negative for chest pain, palpitations and leg swelling.  Gastrointestinal: Negative for abdominal pain, constipation, diarrhea, nausea and vomiting.  Endocrine: Negative for polydipsia, polyphagia and polyuria.  Genitourinary: Positive for dyspareunia. Negative for difficulty urinating, dysuria, enuresis, frequency, genital sores, pelvic pain, urgency, vaginal bleeding and vaginal discharge.  Neurological: Negative for tremors, weakness and headaches.       Objective:   Physical Exam Exam conducted with a chaperone present.  Constitutional:      Appearance: Normal appearance.  Neck:     Thyroid: No thyroid mass, thyromegaly or thyroid tenderness.  Cardiovascular:     Rate and Rhythm: Normal rate and regular rhythm.     Heart sounds: Normal heart sounds, S1 normal and S2 normal. No murmur.  Pulmonary:     Effort: Pulmonary effort is normal.     Breath sounds: Normal breath sounds.  Chest:     Breasts:        Right: Normal.  No mass, skin change or tenderness.        Left: Normal. No mass, skin change or tenderness.  Abdominal:     Palpations: Abdomen is soft. There is no hepatomegaly, splenomegaly or mass.     Tenderness: There is no abdominal tenderness.  Genitourinary:    General: Normal vulva.     Labia:        Right: No rash, tenderness or lesion.        Left: No rash, tenderness or lesion.      Vagina: No erythema, tenderness or lesions.     Comments: External GU: no rashes or lesions. Bimanual exam: No tenderness or obvious masses. Exam limited due to abdominal girth.  No vaginal discharge.  Lymphadenopathy:     Cervical: No cervical adenopathy.     Right cervical: No superficial, deep or posterior cervical adenopathy.    Left cervical: No superficial, deep or posterior cervical adenopathy.     Upper Body:     Right upper body: No supraclavicular, axillary or pectoral adenopathy.     Left upper body: No supraclavicular, axillary or pectoral adenopathy.  Neurological:     Mental Status: She is alert.  Psychiatric:        Mood and Affect: Mood normal.        Behavior: Behavior normal.        Thought Content: Thought content normal.        Judgment: Judgment normal.    Results for orders placed or performed in visit on 08/12/19  POCT HgB A1C  Result Value Ref Range   Hemoglobin A1C 5.5 4.0 - 5.6 %   HbA1c POC (<> result, manual entry)     HbA1c, POC (prediabetic range)     HbA1c, POC (controlled diabetic range)          Assessment & Plan:   Well woman exam  Encounter for screening examination for impaired glucose regulation and diabetes mellitus - Plan: POCT HgB A1C    Education provided on the importance of diet and exercise with goal set for walking 15 min a day for 3-5 times a week Education provided on the potential risks of estrogen use. Discontinue estrace as previously ordered Follow up in 6 months or earlier if needed

## 2019-08-12 NOTE — Progress Notes (Signed)
   Subjective:    Patient ID: Mackenzie Gray, female    DOB: 1966/02/11, 54 y.o.   MRN: [id number]  HPI The patient comes in today for a wellness visit.    A review of their health history was completed.  A review of medications was also completed.  Any needed refills; Estradoil if provider recommends. Provider had ordered but did not want pt to start until seen by [facility].   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 Has ENT appt in July   Preventative health issues were discussed.   Additional concerns: pt would like her A1C checked. Pt having issues with breathing, getting tired really quick, feet hurt constantly.    Review of Systems     Objective:   Physical Exam        Assessment & Plan:

## 2019-09-22 ENCOUNTER — Other Ambulatory Visit (HOSPITAL_COMMUNITY): Payer: Self-pay | Admitting: Family Medicine

## 2019-09-22 DIAGNOSIS — Z1231 Encounter for screening mammogram for malignant neoplasm of breast: Secondary | ICD-10-CM

## 2019-09-24 ENCOUNTER — Other Ambulatory Visit: Payer: Self-pay | Admitting: *Deleted

## 2019-09-24 ENCOUNTER — Encounter: Payer: Self-pay | Admitting: Nurse Practitioner

## 2019-09-24 ENCOUNTER — Ambulatory Visit (HOSPITAL_COMMUNITY)
Admission: RE | Admit: 2019-09-24 | Discharge: 2019-09-24 | Disposition: A | Discharge status: Home / Self Care | Payer: BC Managed Care – PPO | Source: Ambulatory Visit | Attending: Family Medicine | Admitting: Family Medicine

## 2019-09-24 ENCOUNTER — Other Ambulatory Visit: Payer: Self-pay

## 2019-09-24 DIAGNOSIS — Z1159 Encounter for screening for other viral diseases: Secondary | ICD-10-CM

## 2019-09-24 DIAGNOSIS — Z1231 Encounter for screening mammogram for malignant neoplasm of breast: Secondary | ICD-10-CM | POA: Diagnosis present

## 2019-11-03 ENCOUNTER — Encounter: Payer: Self-pay | Admitting: Nurse Practitioner

## 2019-11-04 ENCOUNTER — Ambulatory Visit
Admission: EM | Admit: 2019-11-04 | Discharge: 2019-11-04 | Disposition: A | Discharge status: Home / Self Care | Payer: BC Managed Care – PPO | Attending: Emergency Medicine | Admitting: Emergency Medicine

## 2019-11-04 ENCOUNTER — Other Ambulatory Visit: Payer: Self-pay

## 2019-11-04 ENCOUNTER — Encounter: Payer: Self-pay | Admitting: Nurse Practitioner

## 2019-11-04 DIAGNOSIS — J069 Acute upper respiratory infection, unspecified: Secondary | ICD-10-CM | POA: Diagnosis not present

## 2019-11-04 DIAGNOSIS — Z1152 Encounter for screening for COVID-19: Secondary | ICD-10-CM | POA: Diagnosis not present

## 2019-11-04 NOTE — Discharge Instructions (Addendum)

## 2019-11-04 NOTE — ED Provider Notes (Signed)
[facility]   [id number] 11/04/19 Arrival Time: 1016   CC: COVID symptoms  SUBJECTIVE: History from: patient.  Mackenzie Gray is a 54 y.o. female who presents to the urgent care for complaint of cough, congestion, headache for the past few days.  Denies sick exposure to COVID, flu or strep.  Denies recent travel.  Has tried OTC medication without relief.  Denies aggravating or alleviating factors.  Denies previous symptoms in the past.   Denies fever, chills, fatigue, sinus pain, rhinorrhea, sore throat, SOB, wheezing, chest pain, nausea, changes in bowel or bladder habits.    ROS: As per HPI.  All other pertinent ROS negative.      Past Medical History:  Diagnosis Date   ADD (attention deficit disorder)    Adult   Anxiety    Bronchitis    Depression    Fatty liver    CT 2013   GERD (gastroesophageal reflux disease)    Hypertension    Hypoestrogenism    IBS (irritable bowel syndrome)    Migraines    PONV (postoperative nausea and vomiting)    Past Surgical History:  Procedure Laterality Date   ABDOMINAL HYSTERECTOMY     CARPAL TUNNEL RELEASE Left 09/09/2014   Procedure: LEFT CARPAL TUNNEL RELEASE;  Surgeon: Betha Loa, MD;  Location: [facility];  Service: Orthopedics;  Laterality: Left;   COLONOSCOPY     COLONOSCOPY  10/2007   RMR: normal TI, normal colon, internal hemorrhoids   COLONOSCOPY N/A 02/04/2015   RMR: Grade 2-3 hemorrhoid likely source of hematochezia; otherwise normal ileo-colonoscopy.    COLONOSCOPY WITH ESOPHAGOGASTRODUODENOSCOPY (EGD)  2004   RMR: prepyloric ulcer, negative bx, internal hemorrhoids   disc removal 5-6 vert     ESOPHAGOGASTRODUODENOSCOPY     ESOPHAGOGASTRODUODENOSCOPY N/A 02/04/2015   RMR: Erosive reflux esophagitis   SPINAL FUSION     X 2   TOTAL ABDOMINAL HYSTERECTOMY W/ BILATERAL SALPINGOOPHORECTOMY Left    TUBAL LIGATION     Allergies  Allergen Reactions   Phentermine Swelling    Tongue  swelling   Codeine Itching   Morphine Itching   Other Itching    cats   Tramadol Swelling    Throat swelling   No current facility-administered medications on file prior to encounter.   Current Outpatient Medications on File Prior to Encounter  Medication Sig Dispense Refill   albuterol (VENTOLIN HFA) 108 (90 Base) MCG/ACT inhaler Inhale 2 puffs into the lungs every 6 (six) hours as needed for wheezing or shortness of breath. 18 g 3   doxycycline (VIBRA-TABS) 100 MG tablet Take 1 tablet (100 mg total) by mouth 2 (two) times daily. 20 tablet 0   mupirocin ointment (BACTROBAN) 2 % Place 1 application into the nose 2 (two) times daily. 22 g 0   pantoprazole (PROTONIX) 40 MG tablet Take 1 tablet (40 mg total) by mouth daily. 30 tablet 3   Social History   Socioeconomic History   Marital status: Divorced    Spouse name: Not on file   Number of children: Not on file   Years of education: college   Highest education level: Not on file  Occupational History   Occupation: Radio broadcast assistant  Tobacco Use   Smoking status: Never Smoker   Smokeless tobacco: Never Used  Substance and Sexual Activity   Alcohol use: Yes    Alcohol/week: 0.0 standard drinks    Comment: very rare social   Drug use: No   Sexual activity: Yes  Partners: Male    Birth control/protection: Surgical  Other Topics Concern   Not on file  Social History Narrative   Not on file   Social Determinants of Health   Financial Resource Strain:    Difficulty of Paying Living Expenses: Not on file  Food Insecurity:    Worried About Programme researcher, broadcasting/film/video in the Last Year: Not on file   [employer] of Food in the Last Year: Not on file  Transportation Needs:    Lack of Transportation (Medical): Not on file   Lack of Transportation (Non-Medical): Not on file  Physical Activity:    Days of Exercise per Week: Not on file   Minutes of Exercise per Session: Not on file  Stress:    Feeling of  Stress : Not on file  Social Connections:    Frequency of Communication with Friends and Family: Not on file   Frequency of Social Gatherings with Friends and Family: Not on file   Attends Religious Services: Not on file   Active Member of Clubs or Organizations: Not on file   Attends Banker Meetings: Not on file   Marital Status: Not on file  Intimate Partner Violence:    Fear of Current or Ex-Partner: Not on file   Emotionally Abused: Not on file   Physically Abused: Not on file   Sexually Abused: Not on file   Family History  Problem Relation Age of Onset   Diabetes Mother        Borderline   Hyperlipidemia Mother    Stroke Mother    Heart disease Mother    COPD Mother    Cancer Mother 27       kidney   CAD Mother    Stroke Father    Hyperlipidemia Father    Heart disease Father    COPD Father    Hypertension Father    Colon polyps Father        over age 87   Heart disease Other    Arthritis Other    Lung disease Other    Cancer Other    Asthma Other    Diabetes Other    Kidney disease Other    Crohn's disease Sister    Colon cancer Maternal Grandfather    Lung cancer Other        grandmother   Crohn's disease Other        nephew   Colitis Daughter     OBJECTIVE:  Vitals:   11/04/19 1114  BP: 134/89  Pulse: 87  Resp: 16  Temp: 98 F (36.7 C)  TempSrc: Oral  SpO2: 97%     General appearance: alert; appears fatigued, but nontoxic; speaking in full sentences and tolerating own secretions HEENT: NCAT; Ears: EACs clear, TMs pearly gray; Eyes: PERRL.  EOM grossly intact. Sinuses: nontender; Nose: nares patent without rhinorrhea, Throat: oropharynx clear, tonsils non erythematous or enlarged, uvula midline  Neck: supple without LAD Lungs: unlabored respirations, symmetrical air entry; cough: mild; no respiratory distress; CTAB Heart: regular rate and rhythm.  Radial pulses 2+ symmetrical bilaterally Skin:  warm and dry Psychological: alert and cooperative; normal mood and affect  LABS:  No results found for this or any previous visit (from the past 24 hour(s)).   ASSESSMENT & PLAN:  1. URI with cough and congestion   2. Encounter for screening for COVID-19     No orders of the defined types were placed in this encounter.  Declined symptomatic  medications to be prescribed  Discharge Instructions  COVID testing ordered.  It will take between 2-7 days for test results.  Someone will contact you regarding abnormal results.    In the meantime: You should remain isolated in your home for 10 days from symptom onset AND greater than 24  hours after symptoms resolution (absence of fever without the use of fever-reducing medication and improvement in respiratory symptoms), whichever is longer Get plenty of rest and push fluids Use medications daily for symptom relief Use OTC medications like ibuprofen or tylenol as needed fever or pain Call or go to the ED if you have any new or worsening symptoms such as fever, worsening cough, shortness of breath, chest tightness, chest pain, turning blue, changes in mental status, etc...   Reviewed expectations re: course of current medical issues. Questions answered. Outlined signs and symptoms indicating need for more acute intervention. Patient verbalized understanding. After Visit Summary given.      Note: This document was prepared using Dragon voice recognition software and may include unintentional dictation errors.    Durward Parcel, FNP 11/04/19 [phone]

## 2019-11-04 NOTE — Telephone Encounter (Signed)
Called pt and she went to urgent care today.

## 2019-11-04 NOTE — Telephone Encounter (Signed)
Called pt and she did go to urgent care today.

## 2019-11-04 NOTE — ED Triage Notes (Signed)
Pt presents with nasal congestion and headache for past couple of days , works for school system

## 2019-11-05 LAB — NOVEL CORONAVIRUS, NAA: SARS-CoV-2, NAA: NOT DETECTED

## 2019-11-07 ENCOUNTER — Encounter: Payer: Self-pay | Admitting: Nurse Practitioner

## 2019-11-07 LAB — HEPATITIS C ANTIBODY: Hep C Virus Ab: <0.1 s/co ratio (ref 0.0–0.9)

## 2020-01-13 ENCOUNTER — Encounter: Payer: Self-pay | Admitting: Nurse Practitioner

## 2020-01-15 ENCOUNTER — Encounter: Payer: Self-pay | Admitting: Family Medicine

## 2020-01-15 ENCOUNTER — Ambulatory Visit (INDEPENDENT_AMBULATORY_CARE_PROVIDER_SITE_OTHER): Payer: BC Managed Care – PPO | Admitting: Family Medicine

## 2020-01-15 VITALS — HR 98 | Temp 98.3°F | Resp 16

## 2020-01-15 DIAGNOSIS — B373 Candidiasis of vulva and vagina: Secondary | ICD-10-CM | POA: Diagnosis not present

## 2020-01-15 DIAGNOSIS — B3731 Acute candidiasis of vulva and vagina: Secondary | ICD-10-CM | POA: Insufficient documentation

## 2020-01-15 DIAGNOSIS — J019 Acute sinusitis, unspecified: Secondary | ICD-10-CM

## 2020-01-15 MED ORDER — AMOXICILLIN-POT CLAVULANATE 875-125 MG PO TABS: 1.0000 | ORAL_TABLET | Freq: Two times a day (BID) | ORAL | 0 refills | Status: DC

## 2020-01-15 MED ORDER — FLUCONAZOLE 150 MG PO TABS: 150.0000 mg | ORAL_TABLET | Freq: Once | ORAL | 0 refills | Status: AC

## 2020-01-15 NOTE — Progress Notes (Signed)
Patient ID: Mackenzie Gray, female    DOB: Aug 10, 1965, 54 y.o.   MRN: [id number]   Chief Complaint  Patient presents with  . Fever    cough and congestion fir 3 days   Subjective:  CC: cough and congestion for 3 days  Presents today as an outside visit with a complaint of cough and congestion for 3 days.  Feels like she has a "sinus infection.  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  She has tried Mucinex, DayQuil, which have not improved her symptoms.  Pertinent negatives include no fever, no chills, she had a headache yesterday and vomited once and no ear pain just itching.  She denies sinus pain and pressure.  During the visit however she reports that this cough is not new that she has had at her entire life.    Medical History Mackenzie Gray has a past medical history of ADD (attention deficit disorder), Anxiety, Bronchitis, Depression, Fatty liver, GERD (gastroesophageal reflux disease), Hypertension, Hypoestrogenism, IBS (irritable bowel syndrome), Migraines, and PONV (postoperative nausea and vomiting).   Outpatient Encounter Medications as of 01/15/2020  Medication Sig  . albuterol (VENTOLIN HFA) 108 (90 Base) MCG/ACT inhaler Inhale 2 puffs into the lungs every 6 (six) hours as needed for wheezing or shortness of breath.  Marland Kitchen amoxicillin-clavulanate (AUGMENTIN) 875-125 MG tablet Take 1 tablet by mouth 2 (two) times daily.  . fluconazole (DIFLUCAN) 150 MG tablet Take 1 tablet (150 mg total) by mouth once for 1 dose.  . mupirocin ointment (BACTROBAN) 2 % Place 1 application into the nose 2 (two) times daily.  . pantoprazole (PROTONIX) 40 MG tablet Take 1 tablet (40 mg total) by mouth daily.  . [DISCONTINUED] doxycycline (VIBRA-TABS) 100 MG tablet Take 1 tablet (100 mg total) by mouth 2 (two) times daily.   No facility-administered encounter medications on file as of 01/15/2020.     Review of Systems  Constitutional: Negative  for chills and fever.  HENT: Positive for congestion. Negative for ear pain.   Respiratory: Positive for cough. Negative for shortness of breath.   Cardiovascular: Negative for chest pain.  Gastrointestinal: Negative for abdominal pain.     Vitals Pulse 98   Temp 98.3 F (36.8 C)   Resp 16   SpO2 97%   Objective:   Physical Exam Vitals and nursing note reviewed.  Constitutional:      General: She is not in acute distress.    Appearance: Normal appearance. She is not ill-appearing or toxic-appearing.  HENT:     Right Ear: Tympanic membrane normal.     Left Ear: Tympanic membrane normal.     Nose:     Right Sinus: Maxillary sinus tenderness present.     Left Sinus: Maxillary sinus tenderness present.     Mouth/Throat:     Mouth: Mucous membranes are moist.     Pharynx: Oropharynx is clear.  Cardiovascular:     Rate and Rhythm: Normal rate and regular rhythm.     Heart sounds: Normal heart sounds.  Pulmonary:     Effort: Pulmonary effort is normal.     Breath sounds: Normal breath sounds.  Skin:    General: Skin is warm and dry.  Neurological:     Mental Status: She is alert and oriented to person, place, and time.  Psychiatric:        Mood and Affect: Mood normal.        Behavior:  Behavior normal.        Thought Content: Thought content normal.        Judgment: Judgment normal.      Assessment and Plan   1. Acute rhinosinusitis - amoxicillin-clavulanate (AUGMENTIN) 875-125 MG tablet; Take 1 tablet by mouth 2 (two) times daily.  Dispense: 20 tablet; Refill: 0  2. Yeast infection of the vagina - fluconazole (DIFLUCAN) 150 MG tablet; Take 1 tablet (150 mg total) by mouth once for 1 dose.  Dispense: 1 tablet; Refill: 0   She did have significant maxillary sinus pain upon palpation.  Will treat for acute rhinosinusitis.  Covid test performed today, pending, will notify once results are available.  She usually gets a yeast infection with antibiotic therapy,  Diflucan ordered x1 dose.  Agrees with plan of care discussed today. Understands warning signs to seek further care: Chest pain, shortness of breath, any significant changes in health status. Understands to follow-up if symptoms do not improve, or worsen.  Will notify once Covid results are available.  She is on my chart.  Dorena Bodo, FNP-C

## 2020-01-17 LAB — SARS-COV-2, NAA 2 DAY TAT

## 2020-01-17 LAB — SPECIMEN STATUS REPORT

## 2020-01-17 LAB — NOVEL CORONAVIRUS, NAA: SARS-CoV-2, NAA: NOT DETECTED

## 2020-01-18 ENCOUNTER — Encounter: Payer: Self-pay | Admitting: *Deleted

## 2020-02-16 ENCOUNTER — Encounter: Payer: Self-pay | Admitting: Nurse Practitioner

## 2020-03-03 ENCOUNTER — Ambulatory Visit (INDEPENDENT_AMBULATORY_CARE_PROVIDER_SITE_OTHER): Payer: BC Managed Care – PPO | Admitting: Family Medicine

## 2020-03-03 ENCOUNTER — Other Ambulatory Visit: Payer: Self-pay

## 2020-03-03 DIAGNOSIS — J019 Acute sinusitis, unspecified: Secondary | ICD-10-CM | POA: Diagnosis not present

## 2020-03-03 DIAGNOSIS — H60503 Unspecified acute noninfective otitis externa, bilateral: Secondary | ICD-10-CM | POA: Diagnosis not present

## 2020-03-03 MED ORDER — CIPROFLOXACIN-DEXAMETHASONE 0.3-0.1 % OT SUSP: 4.0000 [drp] | Freq: Two times a day (BID) | OTIC | 2 refills | Status: DC

## 2020-03-03 MED ORDER — CIPROFLOXACIN HCL 500 MG PO TABS: 500.0000 mg | ORAL_TABLET | Freq: Two times a day (BID) | ORAL | 0 refills | Status: DC

## 2020-03-03 NOTE — Progress Notes (Signed)
   Subjective:    Patient ID: Mackenzie Gray, female    DOB: [birth year], 54 y.o.   MRN: [id number]  Otalgia  Episode onset: for 3 days. Associated symptoms include rhinorrhea. Associated symptoms comments: Nasal congestion.   Patient relates is been going on past several days ear pain bilateral worse on the left than the right also relates a lot of head congestion drainage coughing no wheezing   Review of Systems  HENT: Positive for ear pain and rhinorrhea.    Relates some runny nose sore throat denies high fever chills sweats    Objective:   Physical Exam Bilateral otitis externa noted increased pain on the left side not as severe on the right side throat is normal sinus mild tenderness lungs are clear heart regular       Assessment & Plan:  Upper respiratory Bilateral otitis externa Ciprodex drops Cipro antibiotic Covid test taken Warning signs discussed Follow-up if problems

## 2020-03-06 LAB — SPECIMEN STATUS REPORT

## 2020-03-06 LAB — SARS-COV-2, NAA 2 DAY TAT

## 2020-03-06 LAB — NOVEL CORONAVIRUS, NAA: SARS-CoV-2, NAA: NOT DETECTED

## 2020-03-15 ENCOUNTER — Other Ambulatory Visit: Payer: Self-pay

## 2020-03-15 ENCOUNTER — Encounter: Payer: Self-pay | Admitting: Family Medicine

## 2020-03-15 ENCOUNTER — Ambulatory Visit (INDEPENDENT_AMBULATORY_CARE_PROVIDER_SITE_OTHER): Payer: BC Managed Care – PPO | Admitting: Family Medicine

## 2020-03-15 ENCOUNTER — Encounter: Payer: Self-pay | Admitting: Nurse Practitioner

## 2020-03-15 VITALS — HR 108 | Temp 98.0°F | Resp 16

## 2020-03-15 DIAGNOSIS — R059 Cough, unspecified: Secondary | ICD-10-CM | POA: Insufficient documentation

## 2020-03-15 DIAGNOSIS — J111 Influenza due to unidentified influenza virus with other respiratory manifestations: Secondary | ICD-10-CM | POA: Insufficient documentation

## 2020-03-15 MED ORDER — OSELTAMIVIR PHOSPHATE 75 MG PO CAPS: 75.0000 mg | ORAL_CAPSULE | Freq: Two times a day (BID) | ORAL | 0 refills | Status: DC

## 2020-03-15 MED ORDER — ALBUTEROL SULFATE HFA 108 (90 BASE) MCG/ACT IN AERS: 2.0000 | INHALATION_SPRAY | Freq: Four times a day (QID) | RESPIRATORY_TRACT | 3 refills | Status: AC | PRN

## 2020-03-15 NOTE — Progress Notes (Signed)
Patient presents today with respiratory illness Number of days present-  Last night  Symptoms include- headache, chills, going from hot to cold, "feels like a mac truck hit her", fatigued, loss of appetite, burning in chest, only coughing at night, low grade fever  Presence of worrisome signs (severe shortness of breath, lethargy, etc.) - burning in chest   Recent/current visit to urgent care or ER- none  Recent direct exposure to Covid- not known of  Any current Covid testing- was tested on 03/03/21    Patient ID: Mackenzie Gray, female    DOB: 1966/02/06, 55 y.o.   MRN: [id number]   Chief Complaint  Patient presents with  . Headache   Subjective:  CC: headache, muscle aches, "feels like mack truck hit me:   This is a new problem.  Presents today for an acute visit with a complaint of headache, muscle aches, "feels like a Mack truck kidney "associated symptoms include fever, chills, fatigue, headache, cough, loss of appetite, burning in chest when coughing, worse at night.  Reports she is staying hydrated, has drank 8 bottles of water today urinated 3-4 times reports her urine is orange/yellow in color.  She does have an albuterol inhaler which she has been using but not today.  She works for the [employer], her last [facility] test was December 30, negative results at that time.  Reports that she has vomited (bile only).    Medical History [name] has a past medical history of ADD (attention deficit disorder), Anxiety, Bronchitis, Depression, Fatty liver, GERD (gastroesophageal reflux disease), Hypertension, Hypoestrogenism, IBS (irritable bowel syndrome), Migraines, and PONV (postoperative nausea and vomiting).   Outpatient Encounter Medications as of 03/15/2020  Medication Sig  . ciprofloxacin (CIPRO) 500 MG tablet Take 1 tablet (500 mg total) by mouth 2 (two) times daily.  . ciprofloxacin-dexamethasone (CIPRODEX) OTIC suspension Place 4 drops into both ears 2 (two) times daily.   . mupirocin ointment (BACTROBAN) 2 % Place 1 application into the nose 2 (two) times daily.  Marland Kitchen oseltamivir (TAMIFLU) 75 MG capsule Take 1 capsule (75 mg total) by mouth 2 (two) times daily.  . pantoprazole (PROTONIX) 40 MG tablet Take 1 tablet (40 mg total) by mouth daily.  . [DISCONTINUED] albuterol (VENTOLIN HFA) 108 (90 Base) MCG/ACT inhaler Inhale 2 puffs into the lungs every 6 (six) hours as needed for wheezing or shortness of breath.  Marland Kitchen albuterol (VENTOLIN HFA) 108 (90 Base) MCG/ACT inhaler Inhale 2 puffs into the lungs every 6 (six) hours as needed for wheezing or shortness of breath.   No facility-administered encounter medications on file as of 03/15/2020.     Review of Systems  Constitutional: Positive for chills, fatigue and fever.  HENT: Positive for congestion and rhinorrhea.   Respiratory: Positive for cough. Negative for shortness of breath.   Cardiovascular:       Burning in chest   Gastrointestinal: Positive for nausea and vomiting. Negative for abdominal pain.     Vitals Pulse (!) 108   Temp 98 F (36.7 C)   Resp 16   SpO2 97%   Objective:   Physical Exam Vitals reviewed.  Constitutional:      General: She is not in acute distress.    Appearance: Normal appearance. She is ill-appearing.  HENT:     Right Ear: Tympanic membrane normal.     Left Ear: Tympanic membrane normal.     Nose: Rhinorrhea present.     Right Sinus: Maxillary sinus tenderness present.  Left Sinus: Maxillary sinus tenderness present.     Comments: Reports nasal sore on right nare.     Mouth/Throat:     Mouth: Mucous membranes are moist.  Cardiovascular:     Rate and Rhythm: Normal rate and regular rhythm.     Heart sounds: Normal heart sounds.  Pulmonary:     Effort: Pulmonary effort is normal.     Breath sounds: Normal breath sounds.  Lymphadenopathy:     Cervical: No cervical adenopathy.  Skin:    General: Skin is warm and dry.  Neurological:     General: No focal  deficit present.     Mental Status: She is alert.  Psychiatric:        Behavior: Behavior normal.      Assessment and Plan   1. Influenza - oseltamivir (TAMIFLU) 75 MG capsule; Take 1 capsule (75 mg total) by mouth 2 (two) times daily.  Dispense: 10 capsule; Refill: 0  2. Cough - albuterol (VENTOLIN HFA) 108 (90 Base) MCG/ACT inhaler; Inhale 2 puffs into the lungs every 6 (six) hours as needed for wheezing or shortness of breath.  Dispense: 18 g; Refill: 3    Due to knowing that moment that her symptoms started, feeling like she was hit by a truck, will start Tamiflu for possible influenza.  Reports her fever has been as high as 101.2.  She reports that she has Phenergan at home already, and needs an albuterol inhaler refill.  She also has mupirocin ointment that she is applying to the sore in her right nare.  She is encouraged to stay hydrated, to avoid dehydration.  She is on ciprofloxacin, given to her by ENT for otitis media, she continues to have significant maxillary sinus pain and pressure, she will continue the ciprofloxacin as prescribed.  Work note given, warnings as stated below:  Agrees with plan of care discussed today. Understands warning signs to seek further care: Chest pain, shortness of breath, any significant change in health status. Understands to follow-up if symptoms do not improve, or worsen.  Will notify once COVID results are available.  Will discontinue Tamiflu if COVID test is positive.  Covid-19 warning:  Covid-19 is a virus that causes hypoxia (low oxygen level in blood) in some people. If you develop any changes in your usual breathing pattern: difficulty catching your breath, more short winded with activity or with resting, or anything that concerns you about your breathing, do not hesitate to go to the emergency department immediately for evaluation. Covid infection can also affect the way the brain functions if it lacks oxygen, such as, feeling dizzy,  passing out, or feeling confused, if you experience any of these symptoms, please do not delay to seek treatment.  Some people experience gastrointestinal problems with Covid, such as vomiting and diarrhea, dehydration is a serious risk and should be avoided. If you are unable to keep liquids down you may need to go to the emergency department for intravenous fluids to avoid dehydration.   Please alert and involve your family and/or friends to help keep an eye on you while you recover from Covid-19. If you have any questions or concerns about your recovery, please do not hesitate to call the office for guidance.   Covid-19 Quarantine Instructions:   You have tested positive for Covid-19 infection. The current CDC guidelines for quarantine regardless of vaccination status are:    Please quarantine and isolate at home for 5 days.  - If you have no symptoms  or your symptoms are resolving after 5 days you   can leave the home (resolving means no shortness of breath, no fever, no headache, etc). -Continue to wear a mask around others for an additional 5 days.  -If you have a fever or other symptoms continue to stay at home until   resolved.  Use over-the-counter medications for symptoms.If you develop respiratory issues/distress (see Covid warning), seek medical care in the Emergency Department.  If you must leave home or if you have to be around others please wear a mask. Please limit contact with immediate family members in the home, practice social distancing, frequent handwashing and clean hard surfaces touched frequently with household cleaning products. Members of your household will also need to quarantine for 5 days and test on day five if possible.You may also be contacted by the health department for follow up.   Recommend supportive therapy while you are recovering:   1) Get lots of rest.  2) Take over the counter pain medication if needed, such as acetaminophen or ibuprofen. Read and  follow instructions on the label and make sure not to combine other medications that may have same ingredients in it. It is important to not take too much of these ingredients.  3) Drink plenty of caffeine-free fluids. (If you have heart or kidney problems, follow the instructions of your specialist regarding amounts).  4) If you are hungry, eat a bland diet, such as the BRAT diet (bananas, rice, applesauce, toast).  5) Let us know if you are not feeling better in a week.   Chalmers Guest, NP 03/15/2020

## 2020-03-15 NOTE — Addendum Note (Signed)
Addended by: Margaretha Sheffield on: 03/15/2020 04:51 PM   Modules accepted: Orders

## 2020-03-16 ENCOUNTER — Encounter: Payer: Self-pay | Admitting: Nurse Practitioner

## 2020-03-17 ENCOUNTER — Other Ambulatory Visit: Payer: Self-pay | Admitting: Family Medicine

## 2020-03-17 ENCOUNTER — Encounter: Payer: Self-pay | Admitting: Family Medicine

## 2020-03-17 DIAGNOSIS — R110 Nausea: Secondary | ICD-10-CM

## 2020-03-17 MED ORDER — ONDANSETRON 8 MG PO TBDP: 8.0000 mg | ORAL_TABLET | Freq: Three times a day (TID) | ORAL | 0 refills | Status: DC | PRN

## 2020-03-17 NOTE — Progress Notes (Signed)
Nausea

## 2020-03-18 ENCOUNTER — Encounter: Payer: Self-pay | Admitting: Family Medicine

## 2020-03-18 LAB — NOVEL CORONAVIRUS, NAA: SARS-CoV-2, NAA: NOT DETECTED

## 2020-03-18 LAB — SARS-COV-2, NAA 2 DAY TAT

## 2020-04-01 ENCOUNTER — Encounter: Payer: Self-pay | Admitting: Nurse Practitioner

## 2020-05-20 ENCOUNTER — Encounter: Payer: Self-pay | Admitting: Family Medicine

## 2020-05-20 ENCOUNTER — Ambulatory Visit: Payer: BC Managed Care – PPO | Admitting: Family Medicine

## 2020-05-20 ENCOUNTER — Other Ambulatory Visit: Payer: Self-pay

## 2020-05-20 VITALS — BP 130/84 | HR 88 | Temp 98.1°F | Ht 63.5 in | Wt 232.0 lb

## 2020-05-20 DIAGNOSIS — M5442 Lumbago with sciatica, left side: Secondary | ICD-10-CM

## 2020-05-20 MED ORDER — METHYLPREDNISOLONE ACETATE 40 MG/ML IJ SUSP
40.0000 mg | Freq: Once | INTRAMUSCULAR | Status: AC
  Administered 2020-05-20: 40 mg via INTRAMUSCULAR

## 2020-05-20 NOTE — Patient Instructions (Signed)
Heating pad, local pain patch and rest.   Acute Back Pain, Adult Acute back pain is sudden and usually short-lived. It is often caused by an injury to the muscles and tissues in the back. The injury may result from:  A muscle or ligament getting overstretched or torn (strained). Ligaments are tissues that connect bones to each other. Lifting something improperly can cause a back strain.  Wear and tear (degeneration) of the spinal disks. Spinal disks are circular tissue that provide cushioning between the bones of the spine (vertebrae).  Twisting motions, such as while playing sports or doing yard work.  A hit to the back.  Arthritis. You may have a physical exam, lab tests, and imaging tests to find the cause of your pain. Acute back pain usually goes away with rest and home care. Follow these instructions at home: Managing pain, stiffness, and swelling  Treatment may include medicines for pain and inflammation that are taken by mouth or applied to the skin, prescription pain medicine, or muscle relaxants. Take over-the-counter and prescription medicines only as told by your health care provider.  Your health care provider may recommend applying ice during the first 24-48 hours after your pain starts. To do this: ? Put ice in a plastic bag. ? Place a towel between your skin and the bag. ? Leave the ice on for 20 minutes, 2-3 times a day.  If directed, apply heat to the affected area as often as told by your health care provider. Use the heat source that your health care provider recommends, such as a moist heat pack or a heating pad. ? Place a towel between your skin and the heat source. ? Leave the heat on for 20-30 minutes. ? Remove the heat if your skin turns bright red. This is especially important if you are unable to feel pain, heat, or cold. You have a greater risk of getting burned. Activity  Do not stay in bed. Staying in bed for more than 1-2 days can delay your  recovery.  Sit up and stand up straight. Avoid leaning forward when you sit or hunching over when you stand. ? If you work at a desk, sit close to it so you do not need to lean over. Keep your chin tucked in. Keep your neck drawn back, and keep your elbows bent at a 90-degree angle (right angle). ? Sit high and close to the steering wheel when you drive. Add lower back (lumbar) support to your car seat, if needed.  Take short walks on even surfaces as soon as you are able. Try to increase the length of time you walk each day.  Do not sit, drive, or stand in one place for more than 30 minutes at a time. Sitting or standing for long periods of time can put stress on your back.  Do not drive or use heavy machinery while taking prescription pain medicine.  Use proper lifting techniques. When you bend and lift, use positions that put less stress on your back: ? [place] your knees. ? Keep the load close to your body. ? Avoid twisting.  Exercise regularly as told by your health care provider. Exercising helps your back heal faster and helps prevent back injuries by keeping muscles strong and flexible.  Work with a physical therapist to make a safe exercise program, as recommended by your health care provider. Do any exercises as told by your physical therapist.   Lifestyle  Maintain a healthy weight. Extra weight puts stress  on your back and makes it difficult to have good posture.  Avoid activities or situations that make you feel anxious or stressed. Stress and anxiety increase muscle tension and can make back pain worse. Learn ways to manage anxiety and stress, such as through exercise. General instructions  Sleep on a firm mattress in a comfortable position. Try lying on your side with your knees slightly bent. If you lie on your back, put a pillow under your knees.  Follow your treatment plan as told by your health care provider. This may include: ? Cognitive or behavioral  therapy. ? Acupuncture or massage therapy. ? Meditation or yoga. Contact a health care provider if:  You have pain that is not relieved with rest or medicine.  You have increasing pain going down into your legs or buttocks.  Your pain does not improve after 2 weeks.  You have pain at night.  You lose weight without trying.  You have a fever or chills. Get help right away if:  You develop new bowel or bladder control problems.  You have unusual weakness or numbness in your arms or legs.  You develop nausea or vomiting.  You develop abdominal pain.  You feel faint. Summary  Acute back pain is sudden and usually short-lived.  Use proper lifting techniques. When you bend and lift, use positions that put less stress on your back.  Take over-the-counter and prescription medicines and apply heat or ice as directed by your health care provider. This information is not intended to replace advice given to you by your health care provider. Make sure you discuss any questions you have with your health care provider. Document Revised: 11/13/2019 Document Reviewed: 11/13/2019 Elsevier Patient Education  2021 [employer].

## 2020-05-20 NOTE — Progress Notes (Signed)
Patient ID: Mackenzie Gray, female    DOB: 08/28/1965, 55 y.o.   MRN: [id number]   Chief Complaint  Patient presents with  . Hip Pain        Subjective:  CC: left low back pain  This is a new problem.  Presents today for an acute visit with complaint of low back pain radiating down the back of the left leg to the knee.  Went to the urgent care on March 14, was given tramadol injection for pain, reports this helped for 2 days.  Woke up this morning around 3 AM with extreme pain, unable to sit comfortably on that side, reports pain as a dull ache.  Reports that on Monday it was so painful it took her breath away.  Has tried ibuprofen, and stretching.  Denies any injury or falls.   left hip pain. Went to urgent care in [place] on 3/14. Pain radiates to left knee. States she got an injection of tramadol. Tramadol is on pt's allergy list for throat swelling.    Medical History Adam has a past medical history of ADD (attention deficit disorder), Anxiety, Bronchitis, Depression, Fatty liver, GERD (gastroesophageal reflux disease), Hypertension, Hypoestrogenism, IBS (irritable bowel syndrome), Migraines, and PONV (postoperative nausea and vomiting).   Outpatient Encounter Medications as of 05/20/2020  Medication Sig  . albuterol (VENTOLIN HFA) 108 (90 Base) MCG/ACT inhaler Inhale 2 puffs into the lungs every 6 (six) hours as needed for wheezing or shortness of breath.  . ciprofloxacin (CIPRO) 500 MG tablet Take 1 tablet (500 mg total) by mouth 2 (two) times daily.  . ciprofloxacin-dexamethasone (CIPRODEX) OTIC suspension Place 4 drops into both ears 2 (two) times daily.  . mupirocin ointment (BACTROBAN) 2 % Place 1 application into the nose 2 (two) times daily.  . ondansetron (ZOFRAN ODT) 8 MG disintegrating tablet Take 1 tablet (8 mg total) by mouth every 8 (eight) hours as needed for nausea or vomiting.  Marland Kitchen oseltamivir (TAMIFLU) 75 MG capsule Take 1 capsule (75 mg total) by mouth 2 (two) times  daily.  . pantoprazole (PROTONIX) 40 MG tablet Take 1 tablet (40 mg total) by mouth daily.  . [EXPIRED] methylPREDNISolone acetate (DEPO-MEDROL) injection 40 mg    No facility-administered encounter medications on file as of 05/20/2020.     Review of Systems  Constitutional: Negative for chills and fever.  Respiratory: Negative for shortness of breath.   Cardiovascular: Negative for chest pain and leg swelling.  Gastrointestinal: Negative for abdominal pain.  Musculoskeletal: Positive for back pain and gait problem.     Vitals BP 130/84   Pulse 88   Temp 98.1 F (36.7 C)   Ht 5' 3.5" (1.613 m)   Wt 232 lb (105.2 kg)   SpO2 96%   BMI 40.45 kg/m   Objective:   Physical Exam Vitals reviewed.  Constitutional:      Appearance: Normal appearance.  Cardiovascular:     Rate and Rhythm: Normal rate and regular rhythm.     Heart sounds: Normal heart sounds.  Pulmonary:     Effort: Pulmonary effort is normal.     Breath sounds: Normal breath sounds.  Skin:    General: Skin is warm and dry.  Neurological:     General: No focal deficit present.     Mental Status: She is alert.     Comments: 5/5 upper and lower extremity strength. Unable to get onto exam table for straight leg raise.   Psychiatric:  Behavior: Behavior normal.      Assessment and Plan   1. Acute left-sided low back pain with left-sided sciatica - methylPREDNISolone acetate (DEPO-MEDROL) injection 40 mg   Unable to tolerate NSAIDs or prednisone due to history of gastric ulcer.  Will give steroid injection today in the office.  Instructed to utilize heating pad and local pain patch and rest.  Work note provided, she will be out for the next week.  Unable to provide full exam today unable to get on exam table and tolerate movement.  Extreme tenderness to palpation in left lower back area.  Agrees with plan of care discussed today. Understands warning signs to seek further care: chest pain, shortness of  breath, any significant change in health.  Understands to follow-up in 1 month, sooner if anything changes.  Consider orthopedic referral if symptoms do not improve.   Dorena Bodo, NP 05/20/2020

## 2020-05-23 ENCOUNTER — Ambulatory Visit: Payer: BC Managed Care – PPO | Admitting: Family Medicine

## 2020-06-20 ENCOUNTER — Encounter: Payer: BC Managed Care – PPO | Admitting: Family Medicine

## 2020-07-16 ENCOUNTER — Encounter: Payer: Self-pay | Admitting: Family Medicine

## 2020-07-19 ENCOUNTER — Ambulatory Visit: Payer: BC Managed Care – PPO | Admitting: Gastroenterology

## 2020-07-27 ENCOUNTER — Ambulatory Visit (INDEPENDENT_AMBULATORY_CARE_PROVIDER_SITE_OTHER): Payer: BC Managed Care – PPO | Admitting: Internal Medicine

## 2020-07-27 ENCOUNTER — Other Ambulatory Visit: Payer: Self-pay

## 2020-07-27 ENCOUNTER — Encounter (INDEPENDENT_AMBULATORY_CARE_PROVIDER_SITE_OTHER): Payer: Self-pay | Admitting: Internal Medicine

## 2020-07-27 VITALS — BP 128/80 | HR 95 | Temp 97.8°F | Resp 18 | Ht 63.0 in | Wt 233.0 lb

## 2020-07-27 DIAGNOSIS — E785 Hyperlipidemia, unspecified: Secondary | ICD-10-CM | POA: Diagnosis not present

## 2020-07-27 DIAGNOSIS — G4700 Insomnia, unspecified: Secondary | ICD-10-CM

## 2020-07-27 DIAGNOSIS — R5383 Other fatigue: Secondary | ICD-10-CM

## 2020-07-27 DIAGNOSIS — Z131 Encounter for screening for diabetes mellitus: Secondary | ICD-10-CM

## 2020-07-27 DIAGNOSIS — R6889 Other general symptoms and signs: Secondary | ICD-10-CM

## 2020-07-27 DIAGNOSIS — E8941 Symptomatic postprocedural ovarian failure: Secondary | ICD-10-CM

## 2020-07-27 DIAGNOSIS — E282 Polycystic ovarian syndrome: Secondary | ICD-10-CM

## 2020-07-27 DIAGNOSIS — E559 Vitamin D deficiency, unspecified: Secondary | ICD-10-CM

## 2020-07-27 DIAGNOSIS — R5381 Other malaise: Secondary | ICD-10-CM

## 2020-07-27 NOTE — Progress Notes (Signed)
Metrics: Intervention Frequency ACO  Documented Smoking Status Yearly  Screened one or more times in 24 months  Cessation Counseling or  Active cessation medication Past 24 months  Past 24 months   Guideline developer: UpToDate (See UpToDate for funding source) Date Released: 2014       Wellness Office Visit  Subjective:  Patient ID: Mackenzie Gray, female    DOB: [birth year]  Age: 55 y.o. MRN: [id number]  CC: 55 year old lady comes in to establish care. HPI  Feels tired,poor concentration,dry skin,cold intolerance.Does get  Hot flashes. Has a history of acne,hirsuitism. Libido is elevated. Wants to lose weight and become healthier. Has a history of low mood but not suicidal. Past Medical History:  Diagnosis Date  . ADD (attention deficit disorder)    Adult  . Anxiety   . Bronchitis   . Depression   . Fatty liver    CT 2013  . GERD (gastroesophageal reflux disease)   . Hypertension   . Hypoestrogenism   . IBS (irritable bowel syndrome)   . Migraines   . PONV (postoperative nausea and vomiting)    Past Surgical History:  Procedure Laterality Date  . ABDOMINAL HYSTERECTOMY  2009   Still has RIGHT ovary  . CARPAL TUNNEL RELEASE Left 09/09/2014   Procedure: LEFT CARPAL TUNNEL RELEASE;  Surgeon: Betha Loa, MD;  Location: [facility];  Service: Orthopedics;  Laterality: Left;  . COLONOSCOPY    . COLONOSCOPY  10/2007   RMR: normal TI, normal colon, internal hemorrhoids  . COLONOSCOPY N/A 02/04/2015   RMR: Grade 2-3 hemorrhoid likely source of hematochezia; otherwise normal ileo-colonoscopy.   . COLONOSCOPY WITH ESOPHAGOGASTRODUODENOSCOPY (EGD)  2004   RMR: prepyloric ulcer, negative bx, internal hemorrhoids  . disc removal 5-6 vert    . ESOPHAGOGASTRODUODENOSCOPY    . ESOPHAGOGASTRODUODENOSCOPY N/A 02/04/2015   RMR: Erosive reflux esophagitis  . SPINAL FUSION     X 2  . TOTAL ABDOMINAL HYSTERECTOMY W/ BILATERAL SALPINGOOPHORECTOMY Left   . TUBAL LIGATION        Family History  Problem Relation Age of Onset  . Diabetes Mother        Borderline  . Hyperlipidemia Mother   . Stroke Mother   . Heart disease Mother   . COPD Mother   . Cancer Mother 105       kidney  . CAD Mother   . Stroke Father   . Hyperlipidemia Father   . Heart disease Father   . COPD Father   . Hypertension Father   . Colon polyps Father        over age 56  . Heart disease Other   . Arthritis Other   . Lung disease Other   . Cancer Other   . Asthma Other   . Diabetes Other   . Kidney disease Other   . Crohn's disease Sister   . Colon cancer Maternal Grandfather   . Lung cancer Other        grandmother  . Crohn's disease Other        nephew  . 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   Social History   Tobacco Use  . Smoking status: Never Smoker  . Smokeless tobacco: Never Used  Substance Use Topics  . Alcohol use: Yes    Alcohol/week: 0.0 standard drinks  Comment: very rare social    Current Meds  Medication Sig  . albuterol (VENTOLIN HFA) 108 (90 Base) MCG/ACT inhaler Inhale 2 puffs into the lungs every 6 (six) hours as needed for wheezing or shortness of breath.  . ALPRAZolam (XANAX) 0.5 MG tablet Take by mouth.  . pantoprazole (PROTONIX) 40 MG tablet Take 1 tablet (40 mg total) by mouth daily.     Flowsheet Row Nutrition from 02/03/2015 in [facility]  PHQ-9 Total Score 8      Objective:   Today's Vitals: BP 128/80 (BP Location: Left Arm, Patient Position: Sitting, Cuff Size: Normal)   Pulse 95   Temp 97.8 F (36.6 C) (Temporal)   Resp 18   Ht 5\' 3"  (1.6 m)   Wt 233 lb (105.7 kg)   SpO2 98%   BMI 41.27 kg/m  Vitals with BMI 07/27/2020 05/20/2020 03/15/2020  Height 5\' 3"  5' 3.5" -  Weight 233 lbs 232 lbs -  BMI 41.28 40.45 -  Systolic  128 130 -  Diastolic 80 84 -  Pulse 95 88 05/13/2020     Physical Exam  Morbidly obese,pleasant lady.Facial hirsuitism.      Assessment   1. Dyslipidemia   2. Morbid obesity (HCC)   3. Hot flashes due to surgical menopause   4. Insomnia, unspecified type   5. Cold intolerance   6. PCOS (polycystic ovarian syndrome)   7. Malaise and fatigue   8. Screening for diabetes mellitus   9. Vitamin D deficiency disease       Tests ordered Orders Placed This Encounter  Procedures  . Follicle stimulating hormone  . Luteinizing hormone  . Testos,Total,Free and SHBG (Female)  . T3, free  . T4, free  . TSH  . Lipid panel  . CBC  . COMPLETE METABOLIC PANEL WITH GFR  . Hemoglobin A1c  . VITAMIN D 25 Hydroxy (Vit-D Deficiency, Fractures)  . DHEA-sulfate     Plan: 1. Bloodwork ordered. 2. Start drinking 1 gallon of water daily and fast for 16 hours daily. 3. FU in a few weeks to discuss further.Spent 30 minutes reviewing chart and discussing patient's symptoms-I think she has thyroid hypofunction ands also PCOS.   No orders of the defined types were placed in this encounter.   , MD

## 2020-07-27 NOTE — Progress Notes (Signed)
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 Pt wants to get better. Loose weight and get back feeling better. She has been bleeding since beginning of this month. Hemariods she believes.  Pt is needing to get the GI- [facility] office.  Sucide thoughts with the depression. Then Dusty text  Then she gets better and forgets the thoughts.

## 2020-07-28 ENCOUNTER — Encounter (INDEPENDENT_AMBULATORY_CARE_PROVIDER_SITE_OTHER): Payer: Self-pay | Admitting: Internal Medicine

## 2020-07-31 LAB — COMPLETE METABOLIC PANEL WITH GFR
AG Ratio: 1.9 (calc) (ref 1.0–2.5)
ALT: 40 U/L — ABNORMAL HIGH (ref 6–29)
AST: 25 U/L (ref 10–35)
Albumin: 4.6 g/dL (ref 3.6–5.1)
Alkaline phosphatase (APISO): 70 U/L (ref 37–153)
BUN: 17 mg/dL (ref 7–25)
CO2: 29 mmol/L (ref 20–32)
Calcium: 9.2 mg/dL (ref 8.6–10.4)
Chloride: 105 mmol/L (ref 98–110)
Creat: 0.84 mg/dL (ref 0.50–1.05)
GFR, Est African American: 91 mL/min/{1.73_m2} (ref >=60)
GFR, Est Non African American: 79 mL/min/{1.73_m2} (ref >=60)
Globulin: 2.4 g/dL (calc) (ref 1.9–3.7)
Glucose, Bld: 88 mg/dL (ref 65–139)
Potassium: 4.5 mmol/L (ref 3.5–5.3)
Sodium: 139 mmol/L (ref 135–146)
Total Bilirubin: 0.5 mg/dL (ref 0.2–1.2)
Total Protein: 7 g/dL (ref 6.1–8.1)

## 2020-07-31 LAB — LIPID PANEL
Cholesterol: 173 mg/dL (ref <200)
HDL: 57 mg/dL (ref >=50)
LDL Cholesterol (Calc): 86 mg/dL (calc)
Non-HDL Cholesterol (Calc): 116 mg/dL (calc) (ref <130)
Total CHOL/HDL Ratio: 3 (calc) (ref <5.0)
Triglycerides: 200 mg/dL — ABNORMAL HIGH (ref <150)

## 2020-07-31 LAB — CBC
HCT: 40.8 % (ref 35.0–45.0)
Hemoglobin: 13.7 g/dL (ref 11.7–15.5)
MCH: 30.1 pg (ref 27.0–33.0)
MCHC: 33.6 g/dL (ref 32.0–36.0)
MCV: 89.7 fL (ref 80.0–100.0)
MPV: 11.5 fL (ref 7.5–12.5)
Platelets: 282 10*3/uL (ref 140–400)
RBC: 4.55 10*6/uL (ref 3.80–5.10)
RDW: 13.3 % (ref 11.0–15.0)
WBC: 7.1 10*3/uL (ref 3.8–10.8)

## 2020-07-31 LAB — TESTOS,TOTAL,FREE AND SHBG (FEMALE)
Free Testosterone: 2.3 pg/mL (ref 0.1–6.4)
Sex Hormone Binding: 37 nmol/L (ref 17–124)
Testosterone, Total, LC-MS-MS: 19 ng/dL (ref 2–45)

## 2020-07-31 LAB — VITAMIN D 25 HYDROXY (VIT D DEFICIENCY, FRACTURES): Vit D, 25-Hydroxy: 25 ng/mL — ABNORMAL LOW (ref 30–100)

## 2020-07-31 LAB — T3, FREE: T3, Free: 3.6 pg/mL (ref 2.3–4.2)

## 2020-07-31 LAB — TSH: TSH: 2.01 mIU/L

## 2020-07-31 LAB — LUTEINIZING HORMONE: LH: 23.7 m[IU]/mL

## 2020-07-31 LAB — HEMOGLOBIN A1C
Hgb A1c MFr Bld: 5.3 % of total Hgb (ref <5.7)
Mean Plasma Glucose: 105 mg/dL
eAG (mmol/L): 5.8 mmol/L

## 2020-07-31 LAB — FOLLICLE STIMULATING HORMONE: FSH: 47.5 m[IU]/mL

## 2020-07-31 LAB — DHEA-SULFATE: DHEA-SO4: 172 ug/dL — ABNORMAL HIGH (ref 5–167)

## 2020-07-31 LAB — T4, FREE: Free T4: 1.1 ng/dL (ref 0.8–1.8)

## 2020-08-10 ENCOUNTER — Other Ambulatory Visit (HOSPITAL_COMMUNITY): Payer: Self-pay | Admitting: Internal Medicine

## 2020-08-10 DIAGNOSIS — Z1231 Encounter for screening mammogram for malignant neoplasm of breast: Secondary | ICD-10-CM

## 2020-08-24 ENCOUNTER — Ambulatory Visit (INDEPENDENT_AMBULATORY_CARE_PROVIDER_SITE_OTHER): Payer: BC Managed Care – PPO | Admitting: Internal Medicine

## 2020-08-24 ENCOUNTER — Other Ambulatory Visit: Payer: Self-pay

## 2020-08-24 ENCOUNTER — Encounter (INDEPENDENT_AMBULATORY_CARE_PROVIDER_SITE_OTHER): Payer: Self-pay | Admitting: Internal Medicine

## 2020-08-24 DIAGNOSIS — R6889 Other general symptoms and signs: Secondary | ICD-10-CM

## 2020-08-24 DIAGNOSIS — G4700 Insomnia, unspecified: Secondary | ICD-10-CM

## 2020-08-24 DIAGNOSIS — E8941 Symptomatic postprocedural ovarian failure: Secondary | ICD-10-CM | POA: Diagnosis not present

## 2020-08-24 DIAGNOSIS — G5602 Carpal tunnel syndrome, left upper limb: Secondary | ICD-10-CM | POA: Insufficient documentation

## 2020-08-24 DIAGNOSIS — E559 Vitamin D deficiency, unspecified: Secondary | ICD-10-CM

## 2020-08-24 NOTE — Progress Notes (Signed)
She has increase water intake. Still having a bit of rectal bleeding. Started for 2 days then stopped.

## 2020-08-24 NOTE — Patient Instructions (Signed)
[facility] Dietary Recommendations for Weight Loss What to Avoid Avoid added sugars Often added sugar can be found in processed foods such as many condiments, dry cereals, cakes, cookies, chips, crisps, crackers, candies, sweetened drinks, etc.  Read labels and AVOID/DECREASE use of foods with the following in their ingredient list: Sugar, fructose, high fructose corn syrup, sucrose, glucose, maltose, dextrose, molasses, cane sugar, brown sugar, any type of syrup, agave nectar, etc.   Avoid snacking in between meals Avoid foods made with flour If you are going to eat food made with flour, choose those made with whole-grains; and, minimize your consumption as much as is tolerable Avoid processed foods These foods are generally stocked in the middle of the grocery store. Focus on shopping on the perimeter of the grocery.  Avoid Meat  We recommend following a plant-based diet at [facility]. Thus, we recommend avoiding meat as a general rule. Consider eating beans, legumes, eggs, and/or dairy products for regular protein sources If you plan on eating meat limit to 4 ounces of meat at a time and choose lean options such as Fish, chicken, turkey. Avoid red meat intake such as pork and/or steak What to Include Vegetables GREEN LEAFY VEGETABLES: Kale, spinach, mustard greens, collard greens, cabbage, broccoli, etc. OTHER: Asparagus, cauliflower, eggplant, carrots, peas, Brussel sprouts, tomatoes, bell peppers, zucchini, beets, cucumbers, etc. Grains, seeds, and legumes Beans: kidney beans, black eyed peas, garbanzo beans, black beans, pinto beans, etc. Whole, unrefined grains: brown rice, barley, bulgur, oatmeal, etc. Healthy fats  Avoid highly processed fats such as vegetable oil Examples of healthy fats: avocado, olives, virgin olive oil, dark chocolate (?72% Cocoa), nuts (peanuts, almonds, walnuts, cashews, pecans, etc.) None to Low Intake of Animal Sources of Protein Meat  sources: chicken, turkey, salmon, tuna. Limit to 4 ounces of meat at one time. Consider limiting dairy sources, but when choosing dairy focus on: PLAIN Greek yogurt, cottage cheese, high-protein milk Fruit Choose berries  When to Eat Intermittent Fasting: Choosing not to eat for a specific time period, but DO FOCUS ON HYDRATION when fasting Multiple Techniques: Time Restricted Eating: eat 3 meals in a day, each meal lasting no more than 60 minutes, no snacks between meals 16-18 hour fast: fast for 16 to 18 hours up to 7 days a week. Often suggested to start with 2-3 nonconsecutive days per week.  Remember the time you sleep is counted as fasting.  Examples of eating schedule: Fast from 7:00pm-11:00am. Eat between 11:00am-7:00pm.  24-hour fast: fast for 24 hours up to every other day. Often suggested to start with 1 day per week Remember the time you sleep is counted as fasting Examples of eating schedule:  Eating day: eat 2-3 meals on your eating day. If doing 2 meals, each meal should last no more than 90 minutes. If doing 3 meals, each meal should last no more than 60 minutes. Finish last meal by 7:00pm. Fasting day: Fast until 7:00pm.  IF YOU FEEL UNWELL FOR ANY REASON/IN ANY WAY WHEN FASTING, STOP FASTING BY EATING A NUTRITIOUS SNACK OR LIGHT MEAL ALWAYS FOCUS ON HYDRATION DURING FASTS Acceptable Hydration sources: water, broths, tea/coffee (black tea/coffee is best but using a small amount of whole-fat dairy products in coffee/tea is acceptable).  Poor Hydration Sources: anything with sugar or artificial sweeteners added to it  These recommendations have been developed for patients that are actively receiving medical care from either Dr. Crystalee Ventress or Sarah Gray, DNP, NP-C at [facility]. These recommendations   are developed for patients with specific medical conditions and are not meant to be distributed or used by others that are not actively receiving care from either provider  listed above at [facility]. It is not appropriate to participate in the above eating plans without proper medical supervision.   Reference: Fung, J. The obesity code. [place]: [place]; 2016.   

## 2020-08-24 NOTE — Progress Notes (Signed)
Metrics: Intervention Frequency ACO  Documented Smoking Status Yearly  Screened one or more times in 24 months  Cessation Counseling or  Active cessation medication Past 24 months  Past 24 months   Guideline developer: UpToDate (See UpToDate for funding source) Date Released: 2014       Wellness Office Visit  Subjective:  Patient ID: Mackenzie Gray, female    DOB: 01/20/1966  Age: 55 y.o. MRN: [id number]  CC: This lady comes in to discuss all her blood work and further recommendations. HPI  I discussed all her results with her.  She has significant vitamin D deficiency.  All other blood work appears to be relatively normal except for elevated liver enzyme, likely due to fatty liver disease. She continues to have symptoms that she described previously with hot flashes, cold intolerance, dry skin. Past Medical History:  Diagnosis Date   ADD (attention deficit disorder)    Adult   Anxiety    Bronchitis    Depression    Fatty liver    CT 2013   GERD (gastroesophageal reflux disease)    Hypertension    Hypoestrogenism    IBS (irritable bowel syndrome)    Migraines    PONV (postoperative nausea and vomiting)    Past Surgical History:  Procedure Laterality Date   ABDOMINAL HYSTERECTOMY  2009   Still has RIGHT ovary   CARPAL TUNNEL RELEASE Left 09/09/2014   Procedure: LEFT CARPAL TUNNEL RELEASE;  Surgeon: Betha Loa, MD;  Location: [facility];  Service: Orthopedics;  Laterality: Left;   COLONOSCOPY     COLONOSCOPY  10/2007   RMR: normal TI, normal colon, internal hemorrhoids   COLONOSCOPY N/A 02/04/2015   RMR: Grade 2-3 hemorrhoid likely source of hematochezia; otherwise normal ileo-colonoscopy.    COLONOSCOPY WITH ESOPHAGOGASTRODUODENOSCOPY (EGD)  2004   RMR: prepyloric ulcer, negative bx, internal hemorrhoids   disc removal 5-6 vert     ESOPHAGOGASTRODUODENOSCOPY     ESOPHAGOGASTRODUODENOSCOPY N/A 02/04/2015   RMR: Erosive reflux esophagitis   SPINAL FUSION      X 2   TOTAL ABDOMINAL HYSTERECTOMY W/ BILATERAL SALPINGOOPHORECTOMY Left    TUBAL LIGATION       Family History  Problem Relation Age of Onset   Diabetes Mother        Borderline   Hyperlipidemia Mother    Stroke Mother    Heart disease Mother    COPD Mother    Cancer Mother 73       kidney   CAD Mother    Stroke Father    Hyperlipidemia Father    Heart disease Father    COPD Father    Hypertension Father    Colon polyps Father        over age 62   Heart disease Other    Arthritis Other    Lung disease Other    Cancer Other    Asthma Other    Diabetes Other    Kidney disease Other    Crohn's disease Sister    Colon cancer Maternal Grandfather    Lung cancer Other        grandmother   Crohn's disease Other        nephew   Colitis Daughter     Social History   Social History Narrative   Twice married,now in 14 year relationship.Lives alone.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   Social History   Tobacco Use   Smoking status: Never  Smokeless tobacco: Never  Substance Use Topics   Alcohol use: Yes    Alcohol/week: 0.0 standard drinks    Comment: very rare social    No outpatient medications have been marked as taking for the 08/24/20 encounter (Office Visit) with Wilson Singer, MD.     Flowsheet Row Office Visit from 08/24/2020 in [facility]  PHQ-9 Total Score 17       Objective:   Today's Vitals: BP 133/89 (BP Location: Right Arm, Patient Position: Sitting, Cuff Size: Normal)   Pulse (!) 102   Temp 98.9 F (37.2 C) (Temporal)   Resp 18   Ht 5\' 3"  (1.6 m)   Wt 234 lb (106.1 kg)   SpO2 97%   BMI 41.45 kg/m  Vitals with BMI 08/24/2020 07/27/2020 05/20/2020  Height 5\' 3"  5\' 3"  5' 3.5"  Weight 234 lbs 233 lbs 232 lbs  BMI 41.46 41.28 40.45  Systolic 133 128 05/22/2020  Diastolic 89 80 84  Pulse 102 95 88     Physical Exam She remains morbidly obese and has not lost any weight.  Blood  pressure slightly elevated today.      Assessment   1. Morbid obesity (HCC)   2. Hot flashes due to surgical menopause   3. Insomnia, unspecified type   4. Cold intolerance   5. Vitamin D deficiency disease       Tests ordered No orders of the defined types were placed in this encounter.    Plan: 1.  I spent most of the visit discussing nutrition and the concept of intermittent fasting combined with a plant-based diet, ensuring adequate hydration.  I wanted to fast 18 hours every day for the time being.  She will drink at least 1 gallon of water daily.  I have given her diet sheet. 2.  I recommended she start taking vitamin D3 10,000 units daily for vitamin D deficiency. 3.  She is clearly a candidate for B HRT and we can discuss this another time. 4.  Follow-up in about 2 months.    No orders of the defined types were placed in this encounter.   , MD

## 2020-09-01 ENCOUNTER — Telehealth (INDEPENDENT_AMBULATORY_CARE_PROVIDER_SITE_OTHER): Payer: Self-pay

## 2020-09-01 DIAGNOSIS — R21 Rash and other nonspecific skin eruption: Secondary | ICD-10-CM

## 2020-09-01 MED ORDER — NYSTATIN 100000 UNIT/GM EX CREA: 1.0000 "application " | TOPICAL_CREAM | Freq: Two times a day (BID) | CUTANEOUS | 0 refills | Status: DC

## 2020-09-01 NOTE — Telephone Encounter (Signed)
Did not realize her hysterectomy was that long ago.  I will send prescription for nystatin cream that she can apply the area twice a day to [facility].  Please remind her that I do not generally allow prescription medication without seeing the patient in the office or at least on video.  Thus, if her symptoms persist she will have to be seen in person for evaluation.

## 2020-09-01 NOTE — Telephone Encounter (Signed)
Patient called and left a voice message that her site where she had her hysterectomy is oozing clear/white stuff and has a bad odor and very pink around the area. She has a friend who is a Engineer, civil (consulting) and she stated that it is a yeast infection.  Our schedule is full today and not sure if you are able to advise for this?

## 2020-09-01 NOTE — Telephone Encounter (Signed)
Called patient and she stated that her hysterectomy was back in 2008 and she had a lot of this happen and she is out of her Nystatin cream and is asking if she can have a refill of this sent to [facility] in [place]? Patient stated that she does not have enough money for an urgent care visit.

## 2020-09-01 NOTE — Telephone Encounter (Signed)
Called patient and gave her the message from Maralyn Sago. Patient verbalized an understanding and thanked [place] and will let us know next week if not better to be seen.

## 2020-09-06 ENCOUNTER — Encounter (INDEPENDENT_AMBULATORY_CARE_PROVIDER_SITE_OTHER): Payer: Self-pay | Admitting: Internal Medicine

## 2020-09-15 ENCOUNTER — Encounter (INDEPENDENT_AMBULATORY_CARE_PROVIDER_SITE_OTHER): Payer: Self-pay | Admitting: Internal Medicine

## 2020-09-26 ENCOUNTER — Other Ambulatory Visit: Payer: Self-pay

## 2020-09-26 ENCOUNTER — Ambulatory Visit (HOSPITAL_COMMUNITY)
Admission: RE | Admit: 2020-09-26 | Discharge: 2020-09-26 | Disposition: A | Discharge status: Home / Self Care | Payer: BC Managed Care – PPO | Source: Ambulatory Visit | Attending: Internal Medicine | Admitting: Internal Medicine

## 2020-09-26 DIAGNOSIS — Z1231 Encounter for screening mammogram for malignant neoplasm of breast: Secondary | ICD-10-CM | POA: Diagnosis not present

## 2020-10-26 ENCOUNTER — Ambulatory Visit (INDEPENDENT_AMBULATORY_CARE_PROVIDER_SITE_OTHER): Payer: BC Managed Care – PPO | Admitting: Internal Medicine

## 2020-11-15 ENCOUNTER — Encounter: Payer: Self-pay | Admitting: Family Medicine

## 2020-11-18 ENCOUNTER — Telehealth: Payer: BC Managed Care – PPO | Admitting: Nurse Practitioner

## 2020-11-30 ENCOUNTER — Encounter: Payer: Self-pay | Admitting: Gastroenterology

## 2020-11-30 ENCOUNTER — Telehealth: Payer: Self-pay

## 2020-11-30 ENCOUNTER — Other Ambulatory Visit: Payer: Self-pay

## 2020-11-30 ENCOUNTER — Ambulatory Visit: Payer: BC Managed Care – PPO | Admitting: Gastroenterology

## 2020-11-30 VITALS — BP 138/91 | HR 88 | Temp 97.5°F | Ht 63.0 in | Wt 231.2 lb

## 2020-11-30 DIAGNOSIS — K625 Hemorrhage of anus and rectum: Secondary | ICD-10-CM

## 2020-11-30 DIAGNOSIS — R1032 Left lower quadrant pain: Secondary | ICD-10-CM

## 2020-11-30 MED ORDER — LINACLOTIDE 72 MCG PO CAPS: 72.0000 ug | ORAL_CAPSULE | Freq: Every day | ORAL | 0 refills | Status: AC

## 2020-11-30 NOTE — Telephone Encounter (Signed)
Will call pt to schedule TCS w/Propofol ASA 2 w/Dr. Jena Gauss when next schedule is available.

## 2020-11-30 NOTE — Progress Notes (Signed)
Primary Care Physician: [facility]  Primary Gastroenterologist:  Roetta Sessions, MD   Chief Complaint  Patient presents with   Hematochezia    Started in June. Early this month it was dark but now is bright red. At times it can be light and others it will be heavy. Had a lot of diarrhea in June, constipation half of July. Now it just alternates    HPI: Mackenzie Gray is a 55 y.o. female here for further evaluation of rectal bleeding.  Patient last seen in October 2020.  She has history of GERD/IBS, remote peptic ulcer disease, chronic intermittent low-volume hematochezia.  Last colonoscopy in 2016, she had grade 2-3 hemorrhoids at the time.  Last EGD December 2016 with erosive reflux esophagitis.  Previously recommended updating colonoscopy but she declined due to finances.  Started having increased rectal bleeding in June/July. Sometimes very heavy. Could be dark or light. In June blood was very dark, stool light.  No melena.  Stools from [place] 5-7. No solid stools. BM once every other day.  Even though stools are loose, she feels like inadequate bowel movement.  Complains of bloating.  Some nausea but no vomiting.  No heartburn.  She has had some left lower quadrant discomfort, worse with intercourse.  States she has had a complete hysterectomy.  Last imaging in August 2020 contrasted CT showing diffuse hepatic steatosis, adnexal regions unremarkable.  No reported diverticulosis.  Patient has tried Preparation H for rectal bleeding without any improvement.  Certain foods tend to cause stools to be looser such as dairy, pineapple, lemonade, OJ.    Current Outpatient Medications  Medication Sig Dispense Refill   albuterol (VENTOLIN HFA) 108 (90 Base) MCG/ACT inhaler Inhale 2 puffs into the lungs every 6 (six) hours as needed for wheezing or shortness of breath. 18 g 3   loratadine (CLARITIN) 10 MG tablet Take 10 mg by mouth daily as needed.     No current  facility-administered medications for this visit.    Allergies as of 11/30/2020 - Review Complete 11/30/2020  Allergen Reaction Noted   Phentermine Swelling 10/25/2014   Codeine Itching    Morphine Itching    Other Itching 03/22/2014   Tramadol Swelling 04/30/2014   Past Medical History:  Diagnosis Date   ADD (attention deficit disorder)    Adult   Anxiety    Bronchitis    Depression    Fatty liver    CT 2013   GERD (gastroesophageal reflux disease)    Hypertension    Hypoestrogenism    IBS (irritable bowel syndrome)    Migraines    PONV (postoperative nausea and vomiting)    Past Surgical History:  Procedure Laterality Date   ABDOMINAL HYSTERECTOMY  2009   Still has RIGHT ovary   CARPAL TUNNEL RELEASE Left 09/09/2014   Procedure: LEFT CARPAL TUNNEL RELEASE;  Surgeon: Betha Loa, MD;  Location: [facility];  Service: Orthopedics;  Laterality: Left;   COLONOSCOPY     COLONOSCOPY  10/2007   RMR: normal TI, normal colon, internal hemorrhoids   COLONOSCOPY N/A 02/04/2015   RMR: Grade 2-3 hemorrhoid likely source of hematochezia; otherwise normal ileo-colonoscopy.    COLONOSCOPY WITH ESOPHAGOGASTRODUODENOSCOPY (EGD)  2004   RMR: prepyloric ulcer, negative bx, internal hemorrhoids   disc removal 5-6 vert     ESOPHAGOGASTRODUODENOSCOPY     ESOPHAGOGASTRODUODENOSCOPY N/A 02/04/2015   RMR: Erosive reflux esophagitis   SPINAL FUSION     X 2  TOTAL ABDOMINAL HYSTERECTOMY W/ BILATERAL SALPINGOOPHORECTOMY Left    TUBAL LIGATION     Family History  Problem Relation Age of Onset   Diabetes Mother        Borderline   Hyperlipidemia Mother    Stroke Mother    Heart disease Mother    COPD Mother    Cancer Mother 36       kidney   CAD Mother    Stroke Father    Hyperlipidemia Father    Heart disease Father    COPD Father    Hypertension Father    Colon polyps Father        over age 67   Heart disease Other    Arthritis Other    Lung disease Other     Cancer Other    Asthma Other    Diabetes Other    Kidney disease Other    Crohn's disease Sister    Colon cancer Maternal Grandfather    Lung cancer Other        grandmother   Crohn's disease Other        nephew   Colitis Daughter    Social History   Tobacco Use   Smoking status: Never   Smokeless tobacco: Never  Substance Use Topics   Alcohol use: Yes    Alcohol/week: 0.0 standard drinks    Comment: very rare social   Drug use: No    ROS:  General: Negative for anorexia, weight loss, fever, chills, fatigue, weakness. ENT: Negative for hoarseness, difficulty swallowing , nasal congestion. CV: Negative for chest pain, angina, palpitations, dyspnea on exertion, peripheral edema.  Respiratory: Negative for dyspnea at rest, dyspnea on exertion, cough, sputum, wheezing.  GI: See history of present illness. GU:  Negative for dysuria, hematuria, urinary incontinence, urinary frequency, nocturnal urination.  Endo: Negative for unusual weight change.    Physical Examination:   BP (!) 138/91   Pulse 88   Temp (!) 97.5 F (36.4 C)   Ht 5\' 3"  (1.6 m)   Wt 231 lb 3.2 oz (104.9 kg)   BMI 40.96 kg/m   General: Well-nourished, well-developed in no acute distress.  Eyes: No icterus. Mouth: masked Lungs: Clear to auscultation bilaterally.  Heart: Regular rate and rhythm, no murmurs rubs or gallops.  Abdomen: Bowel sounds are normal,  nondistended, no hepatosplenomegaly or masses, no abdominal bruits or hernia , no rebound or guarding.  Diffuse tenderness with minimal palpation, increased tenderness LLQ, diminishes with distraction Extremities: No lower extremity edema. No clubbing or deformities. Neuro: Alert and oriented x 4   Skin: Warm and dry, no jaundice.   Psych: Alert and cooperative, normal mood and affect.  Labs:  Lab Results  Component Value Date   CREATININE 0.84 07/27/2020   BUN 17 07/27/2020   NA 139 07/27/2020   K 4.5 07/27/2020   CL 105 07/27/2020   CO2 29  07/27/2020   Lab Results  Component Value Date   HGBA1C 5.3 07/27/2020   Lab Results  Component Value Date   WBC 7.1 07/27/2020   HGB 13.7 07/27/2020   HCT 40.8 07/27/2020   MCV 89.7 07/27/2020   PLT 282 07/27/2020   Lab Results  Component Value Date   ALT 40 (H) 07/27/2020   AST 25 07/27/2020   ALKPHOS 93 05/01/2019   BILITOT 0.5 07/27/2020     Imaging Studies: No results found.   Assessment: Pleasant 55 year old female with history of GERD, IBS, intermittent rectal bleeding, remote peptic  ulcer disease presenting for further evaluation of rectal bleeding and bowel concerns.  Patient noted increased rectal bleeding over the summer, sometimes passing large volume blood per rectum.  Denies melena.  Notes all of her stools are loose, BM every other day but feels unproductive.  Complains of postprandial bloating, left lower quadrant discomfort which is worse if she does not have an adequate bowel movement.  Her last colonoscopy was in 2016.  Given change in bowels of blood per rectum, would recommend colonoscopy at this time.  Provide low-dose Linzess short-term (patient does not want to take regular medication), to see if this helps with her abdominal pain and bloating.  If symptoms persist she will let me know.  At this time she wants avoid CT scan if possible.  GERD seems to be well controlled with dietary measures.  States she was taking significant amount of Aleve prior to June but has since stopped.  Remote peptic ulcer disease.  Symptoms sound most consistent with lower GI source.   Plan: Linzess 72 mcg daily.  #12 samples provided. Check CBC and celiac serologies. Colonoscopy with Dr. Jena Gauss.  Plan for propofol given use of high-dose conscious sedation in the past. ASA II.  I have discussed the risks, alternatives, benefits with regards to but not limited to the risk of reaction to medication, bleeding, infection, perforation and the patient is agreeable to proceed. Written  consent to be obtained.

## 2020-11-30 NOTE — Patient Instructions (Signed)
Try Linzess once daily before breakfast to see if emptying your bowels help your abdominal pain.  Labs at [facility]: will check your hemoglobin (for anemia) and screen for celiac disease. Colonoscopy as scheduled. See separate instructions.

## 2020-12-04 LAB — CBC WITH DIFFERENTIAL/PLATELET
Basophils Absolute: 0.1 10*3/uL (ref 0.0–0.2)
Basos: 1 %
EOS (ABSOLUTE): 0.2 10*3/uL (ref 0.0–0.4)
Eos: 3 %
Hematocrit: 41.4 % (ref 34.0–46.6)
Hemoglobin: 13.8 g/dL (ref 11.1–15.9)
Immature Grans (Abs): 0 10*3/uL (ref 0.0–0.1)
Immature Granulocytes: 0 %
Lymphocytes Absolute: 2.2 10*3/uL (ref 0.7–3.1)
Lymphs: 31 %
MCH: 29.9 pg (ref 26.6–33.0)
MCHC: 33.3 g/dL (ref 31.5–35.7)
MCV: 90 fL (ref 79–97)
Monocytes Absolute: 0.6 10*3/uL (ref 0.1–0.9)
Monocytes: 8 %
Neutrophils Absolute: 4 10*3/uL (ref 1.4–7.0)
Neutrophils: 57 %
Platelets: 280 10*3/uL (ref 150–450)
RBC: 4.62 x10E6/uL (ref 3.77–5.28)
RDW: 13.1 % (ref 11.7–15.4)
WBC: 7.1 10*3/uL (ref 3.4–10.8)

## 2020-12-04 LAB — TISSUE TRANSGLUTAMINASE, IGA: Transglutaminase IgA: <2 U/mL (ref 0–3)

## 2020-12-04 LAB — IGA: IgA/Immunoglobulin A, Serum: 210 mg/dL (ref 87–352)

## 2020-12-14 MED ORDER — PEG 3350-KCL-NA BICARB-NACL 420 G PO SOLR: ORAL | 0 refills | Status: AC

## 2020-12-14 NOTE — Addendum Note (Signed)
Addended by: Armstead Peaks on: 12/14/2020 04:19 PM   Modules accepted: Orders

## 2020-12-14 NOTE — Telephone Encounter (Signed)
Patient returned call. She has been scheduled for 11/28 at 9:15am. Aware will mail prep instructions and send Rx to pharmacy.

## 2020-12-14 NOTE — Telephone Encounter (Signed)
Called pt to schedule, LMOVM

## 2021-01-17 ENCOUNTER — Telehealth: Payer: Self-pay | Admitting: *Deleted

## 2021-01-17 NOTE — Telephone Encounter (Signed)
Received call from pt requesting to reschedule procedure on for 11/28 with Dr. Jena Gauss.  Patient has been rescheduled for 12/12 at 12:30pm. Aware will mail new instructions. Message sent to endo.

## 2021-02-08 ENCOUNTER — Telehealth: Payer: Self-pay | Admitting: Internal Medicine

## 2021-02-08 NOTE — Telephone Encounter (Signed)
Called pt, she said her job didn't want her take off work 02/13/21 for TCS. She asked if she could wait until June to do procedure. Informed her would need another OV if done procedure in June. She then said she really can't afford the procedure right now. She requested to cancel procedure d/t not being able to afford right now. Advised her to call office when she is ready to proceed with TCS. Endo scheduler informed to cancel TCS.

## 2021-02-08 NOTE — Telephone Encounter (Signed)
Noted  

## 2021-02-08 NOTE — Telephone Encounter (Signed)
PATIENT NEEDS TO RESCHEDULE HER PROCEDURE, PLEASE CALL [phone]

## 2021-02-13 ENCOUNTER — Encounter (HOSPITAL_COMMUNITY): Admission: RE | Payer: Self-pay | Source: Home / Self Care

## 2021-02-13 ENCOUNTER — Ambulatory Visit (HOSPITAL_COMMUNITY)
Admission: RE | Admit: 2021-02-13 | Payer: BC Managed Care – PPO | Source: Home / Self Care | Admitting: Internal Medicine

## 2021-02-13 SURGERY — COLONOSCOPY WITH PROPOFOL
Anesthesia: Monitor Anesthesia Care

## 2022-02-23 ENCOUNTER — Ambulatory Visit: Payer: BC Managed Care – PPO | Admitting: Diagnostic Neuroimaging

## 2022-02-23 ENCOUNTER — Encounter: Payer: Self-pay | Admitting: Diagnostic Neuroimaging

## 2022-02-23 VITALS — BP 115/76 | HR 102 | Ht 64.0 in | Wt 226.4 lb

## 2022-02-23 DIAGNOSIS — G894 Chronic pain syndrome: Secondary | ICD-10-CM

## 2022-02-23 DIAGNOSIS — R200 Anesthesia of skin: Secondary | ICD-10-CM
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
## 2022-02-23 NOTE — Patient Instructions (Signed)
  NUMBNESS / PAIN (hands and feet, since ~2006) - check neuropathy labs - consider pain mgmt referral - optimize nutrition, exercise, sleep, stress mgmt

## 2022-03-01 LAB — ANA,IFA RA DIAG PNL W/RFLX TIT/PATN
ANA Titer 1: NEGATIVE
Cyclic Citrullin Peptide Ab: 3 units (ref 0–19)
Rheumatoid fact SerPl-aCnc: <10 IU/mL (ref <14.0)

## 2022-03-01 LAB — VITAMIN B12: Vitamin B-12: 634 pg/mL (ref 232–1245)

## 2022-03-01 LAB — SJOGREN'S SYNDROME ANTIBODS(SSA + SSB)
ENA SSA (RO) Ab: <0.2 AI (ref 0.0–0.9)
ENA SSB (LA) Ab: <0.2 AI (ref 0.0–0.9)

## 2022-03-01 LAB — TSH: TSH: 1.48 u[IU]/mL (ref 0.450–4.500)

## 2022-03-01 LAB — CK: Total CK: 108 U/L (ref 32–182)

## 2022-03-01 LAB — ALDOLASE: Aldolase: 6.5 U/L (ref 3.3–10.3)

## 2022-10-10 NOTE — Progress Notes (Signed)
This encounter was created in error - please disregard.

## 2023-05-01 ENCOUNTER — Ambulatory Visit (INDEPENDENT_AMBULATORY_CARE_PROVIDER_SITE_OTHER): Payer: 59 | Admitting: Podiatry

## 2023-05-01 ENCOUNTER — Encounter: Payer: Self-pay | Admitting: Podiatry

## 2023-05-01 DIAGNOSIS — M722 Plantar fascial fibromatosis: Secondary | ICD-10-CM | POA: Diagnosis not present

## 2023-05-01 NOTE — Progress Notes (Unsigned)
 Subjective:  Patient ID: Mackenzie Gray, female    DOB: Jul 15, 1965,  MRN: [id number]  Chief Complaint  Patient presents with   Callouses    58 y.o. female presents with the above complaint.  Patient presents with bilateral plantar fasciitis/heel pain that has been off for quite some time hurts with ambulation worse with pressure she has not seen anyone else prior to seeing me she would like to discuss treatment options for this pain scale 7 out of 10 hurts with ambulation hurts with taking for step in the morning.  Denies any other acute complaints.   Review of Systems: Negative except as noted in the HPI. Denies N/V/F/Ch.  Past Medical History:  Diagnosis Date   ADD (attention deficit disorder)    Adult   Anxiety    Bronchitis    Depression    Fatty liver    CT 2013   GERD (gastroesophageal reflux disease)    Hypertension    Hypoestrogenism    IBS (irritable bowel syndrome)    Migraines    PONV (postoperative nausea and vomiting)     Current Outpatient Medications:    albuterol (VENTOLIN HFA) 108 (90 Base) MCG/ACT inhaler, Inhale 2 puffs into the lungs every 6 (six) hours as needed for wheezing or shortness of breath., Disp: 18 g, Rfl: 3   cholecalciferol (VITAMIN D3) 25 MCG (1000 UNIT) tablet, Take 1,000 Units by mouth daily. Gummies, Disp: , Rfl:    ibuprofen (ADVIL) 200 MG tablet, Take 200 mg by mouth every 8 (eight) hours as needed for moderate pain., Disp: , Rfl:    linaclotide (LINZESS) 72 MCG capsule, Take 1 capsule (72 mcg total) by mouth daily before breakfast. (Patient not taking: Reported on 02/07/2021), Disp: 12 capsule, Rfl: 0   loratadine (CLARITIN REDITABS) 10 MG dissolvable tablet, Take 10 mg by mouth daily., Disp: , Rfl:    Multiple Vitamins-Minerals (MULTIVITAMIN WITH MINERALS) tablet, Take 1 tablet by mouth daily. Gummies, Disp: , Rfl:    OVER THE COUNTER MEDICATION, Place 1 patch onto the skin daily. Thrive Patch (Patient not taking: Reported on 02/23/2022),  Disp: , Rfl:    OVER THE COUNTER MEDICATION, Take 2 capsules by mouth daily. Thrive Capsules (Patient not taking: Reported on 02/23/2022), Disp: , Rfl:    polyethylene glycol-electrolytes (NULYTELY) 420 g solution, As directed (Patient not taking: Reported on 02/23/2022), Disp: 4000 mL, Rfl: 0   vitamin B-12 (CYANOCOBALAMIN) 1000 MCG tablet, Take 1,000 mcg by mouth daily. Gummies, Disp: , Rfl:   Social History   Tobacco Use  Smoking Status Never  Smokeless Tobacco Never    Allergies  Allergen Reactions   Phentermine Swelling    Tongue swelling   Tramadol Swelling    Throat swelling   Codeine Itching   Morphine Itching    Makes skin crawl    Other Itching    cats   Objective:  There were no vitals filed for this visit. There is no height or weight on file to calculate BMI. Constitutional Well developed. Well nourished.  Vascular Dorsalis pedis pulses palpable bilaterally. Posterior tibial pulses palpable bilaterally. Capillary refill normal to all digits.  No cyanosis or clubbing noted. Pedal hair growth normal.  Neurologic Normal speech. Oriented to person, place, and time. Epicritic sensation to light touch grossly present bilaterally.  Dermatologic Nails well groomed and normal in appearance. No open wounds. No skin lesions.  Orthopedic: Normal joint ROM without pain or crepitus bilaterally. No visible deformities. Tender to palpation at the calcaneal  tuber bilaterally. No pain with calcaneal squeeze bilaterally. Ankle ROM diminished range of motion bilaterally. Silfverskiold Test: positive bilaterally.   Radiographs: None  Assessment:   1. Plantar fasciitis of right foot   2. Plantar fasciitis of left foot    Plan:  Patient was evaluated and treated and all questions answered.  Plantar Fasciitis, bilaterally - XR reviewed as above.  - Educated on icing and stretching. Instructions given.  - Injection delivered to the plantar fascia as below. - DME:  Plantar fascial brace dispensed to support the medial longitudinal arch of the foot and offload pressure from the heel and prevent arch collapse during weightbearing - Pharmacologic management: None  Procedure: Injection Tendon/Ligament Location: Bilateral plantar fascia at the glabrous junction; medial approach. Skin Prep: alcohol Injectate: 0.5 cc 0.5% marcaine plain, 0.5 cc of 1% Lidocaine, 0.5 cc kenalog 10. Disposition: Patient tolerated procedure well. Injection site dressed with a band-aid.  No follow-ups on file.

## 2023-05-29 ENCOUNTER — Ambulatory Visit: Payer: 59 | Admitting: Podiatry

## 2023-06-26 ENCOUNTER — Ambulatory Visit (INDEPENDENT_AMBULATORY_CARE_PROVIDER_SITE_OTHER): Admitting: Podiatry

## 2023-06-26 DIAGNOSIS — M21961 Unspecified acquired deformity of right lower leg: Secondary | ICD-10-CM

## 2023-06-26 DIAGNOSIS — M722 Plantar fascial fibromatosis: Secondary | ICD-10-CM | POA: Diagnosis not present

## 2023-06-26 DIAGNOSIS — M21962 Unspecified acquired deformity of left lower leg: Secondary | ICD-10-CM

## 2023-06-26 NOTE — Progress Notes (Signed)
 Subjective:  Patient ID: Mackenzie Gray, female    DOB: 1965/05/07,  MRN: [id number]  Chief Complaint  Patient presents with   Plantar Fasciitis    58 y.o. female presents with the above complaint.  Patient presents with complaint bilateral plantar fasciitis she states she is doing a lot better injection helped she still has some residual pain would like to discuss next treatment plan denies any other acute complaints   Review of Systems: Negative except as noted in the HPI. Denies N/V/F/Ch.  Past Medical History:  Diagnosis Date   ADD (attention deficit disorder)    Adult   Anxiety    Bronchitis    Depression    Fatty liver    CT 2013   GERD (gastroesophageal reflux disease)    Hypertension    Hypoestrogenism    IBS (irritable bowel syndrome)    Migraines    PONV (postoperative nausea and vomiting)     Current Outpatient Medications:    albuterol  (VENTOLIN  HFA) 108 (90 Base) MCG/ACT inhaler, Inhale 2 puffs into the lungs every 6 (six) hours as needed for wheezing or shortness of breath., Disp: 18 g, Rfl: 3   cholecalciferol (VITAMIN D3) 25 MCG (1000 UNIT) tablet, Take 1,000 Units by mouth daily. Gummies, Disp: , Rfl:    ibuprofen  (ADVIL ) 200 MG tablet, Take 200 mg by mouth every 8 (eight) hours as needed for moderate pain., Disp: , Rfl:    linaclotide  (LINZESS ) 72 MCG capsule, Take 1 capsule (72 mcg total) by mouth daily before breakfast. (Patient not taking: Reported on 02/07/2021), Disp: 12 capsule, Rfl: 0   loratadine (CLARITIN REDITABS) 10 MG dissolvable tablet, Take 10 mg by mouth daily., Disp: , Rfl:    Multiple Vitamins-Minerals (MULTIVITAMIN WITH MINERALS) tablet, Take 1 tablet by mouth daily. Gummies, Disp: , Rfl:    OVER THE COUNTER MEDICATION, Place 1 patch onto the skin daily. Thrive Patch (Patient not taking: Reported on 02/23/2022), Disp: , Rfl:    OVER THE COUNTER MEDICATION, Take 2 capsules by mouth daily. Thrive Capsules (Patient not taking: Reported on  02/23/2022), Disp: , Rfl:    polyethylene glycol-electrolytes (NULYTELY) 420 g solution, As directed (Patient not taking: Reported on 02/23/2022), Disp: 4000 mL, Rfl: 0   vitamin B-12 (CYANOCOBALAMIN) 1000 MCG tablet, Take 1,000 mcg by mouth daily. Gummies, Disp: , Rfl:   Social History   Tobacco Use  Smoking Status Never  Smokeless Tobacco Never    Allergies  Allergen Reactions   Phentermine Swelling    Tongue swelling   Tramadol  Swelling    Throat swelling   Codeine Itching   Morphine Itching    Makes skin crawl    Other Itching    cats   Objective:  There were no vitals filed for this visit. There is no height or weight on file to calculate BMI. Constitutional Well developed. Well nourished.  Vascular Dorsalis pedis pulses palpable bilaterally. Posterior tibial pulses palpable bilaterally. Capillary refill normal to all digits.  No cyanosis or clubbing noted. Pedal hair growth normal.  Neurologic Normal speech. Oriented to person, place, and time. Epicritic sensation to light touch grossly present bilaterally.  Dermatologic Nails well groomed and normal in appearance. No open wounds. No skin lesions.  Orthopedic: Normal joint ROM without pain or crepitus bilaterally. No visible deformities. Tender to palpation at the calcaneal tuber bilaterally. No pain with calcaneal squeeze bilaterally. Ankle ROM diminished range of motion bilaterally. Silfverskiold Test: positive bilaterally.   Radiographs: None  Assessment:  No diagnosis found.  Plan:  Patient was evaluated and treated and all questions answered.  Plantar Fasciitis, bilaterally - XR reviewed as above.  - Educated on icing and stretching. Instructions given.  - Second injection delivered to the plantar fascia as below. - DME: Plantar fascial brace dispensed to support the medial longitudinal arch of the foot and offload pressure from the heel and prevent arch collapse during weightbearing -  Pharmacologic management: None  Pes planovalgus/foot deformity -I explained to patient the etiology of pes planovalgus and relationship with Planter fasciitis and various treatment options were discussed.  Given patient foot structure in the setting of Planter fasciitis I believe patient will benefit from custom-made orthotics to help control the hindfoot motion support the arch of the foot and take the stress away from plantar fascial.  Patient agrees with the plan like to proceed with orthotics -Patient was casted for orthotics   Procedure: Injection Tendon/Ligament Location: Bilateral plantar fascia at the glabrous junction; medial approach. Skin Prep: alcohol Injectate: 0.5 cc 0.5% marcaine  plain, 0.5 cc of 1% Lidocaine , 0.5 cc kenalog  10. Disposition: Patient tolerated procedure well. Injection site dressed with a band-aid.  No follow-ups on file.

## 2023-07-04 ENCOUNTER — Encounter (HOSPITAL_BASED_OUTPATIENT_CLINIC_OR_DEPARTMENT_OTHER): Payer: Self-pay | Admitting: Internal Medicine

## 2023-07-04 DIAGNOSIS — G4700 Insomnia, unspecified: Secondary | ICD-10-CM

## 2023-07-04 DIAGNOSIS — G4710 Hypersomnia, unspecified: Secondary | ICD-10-CM

## 2023-07-04 DIAGNOSIS — R5383 Other fatigue: Secondary | ICD-10-CM

## 2023-07-04 DIAGNOSIS — R0681 Apnea, not elsewhere classified: Secondary | ICD-10-CM

## 2023-07-08 NOTE — Progress Notes (Signed)
  Orthotic order placed will schedule for fitting when in

## 2023-07-24 ENCOUNTER — Ambulatory Visit: Admitting: Podiatry

## 2023-09-10 ENCOUNTER — Telehealth: Payer: Self-pay

## 2023-09-10 NOTE — Telephone Encounter (Signed)
 LVM to schedule orthotic pick up

## 2023-10-04 ENCOUNTER — Other Ambulatory Visit: Payer: Self-pay | Admitting: Vascular Surgery

## 2023-10-04 ENCOUNTER — Ambulatory Visit

## 2023-10-04 DIAGNOSIS — M7989 Other specified soft tissue disorders: Secondary | ICD-10-CM

## 2023-10-08 NOTE — Procedures (Signed)
 Orders only

## 2023-11-05 ENCOUNTER — Ambulatory Visit (INDEPENDENT_AMBULATORY_CARE_PROVIDER_SITE_OTHER): Admitting: Physician Assistant

## 2023-11-05 ENCOUNTER — Ambulatory Visit (HOSPITAL_COMMUNITY)
Admission: RE | Admit: 2023-11-05 | Discharge: 2023-11-05 | Disposition: A | Discharge status: Home / Self Care | Source: Ambulatory Visit | Attending: Vascular Surgery | Admitting: Vascular Surgery

## 2023-11-05 VITALS — BP 136/84 | HR 99 | Temp 98.1°F | Wt 236.4 lb

## 2023-11-05 DIAGNOSIS — M7989 Other specified soft tissue disorders: Secondary | ICD-10-CM | POA: Insufficient documentation

## 2023-11-05 DIAGNOSIS — I872 Venous insufficiency (chronic) (peripheral): Secondary | ICD-10-CM

## 2023-11-05 DIAGNOSIS — I8393 Asymptomatic varicose veins of bilateral lower extremities: Secondary | ICD-10-CM | POA: Insufficient documentation

## 2023-11-05 NOTE — Progress Notes (Signed)
 Requested by:  Lari Elspeth BRAVO, MD [address] [place],  [place] [address]  Reason for consultation: Painful spider veins   History of Present Illness   Mackenzie Gray is a 58 y.o. (1965-08-10) female who presents for evaluation of painful spider veins.  The patient states she has noticed development of spider and reticular veins on her legs for the past 7 years.  Over the past 3 years some of the spider veins behind her right knee have become very itchy, tender and painful.  Typically they cause her more burning pain later on in the day after being on her feet for several hours.  As the day goes on her lower legs and feet get swollen, achy, and heavy.  She tries to wear knee-high compression stockings at work, which moderately helps her symptoms.  She says as soon as she gets home from work she props her legs up in the recliner.   She has worked in a [employer] for the past 17 years.  She stands on a concrete floor for several hours a day.  She has no prior history of DVT or previous vein procedures.  Past Medical History:  Diagnosis Date   ADD (attention deficit disorder)    Adult   Anxiety    Bronchitis    Depression    Fatty liver    CT 2013   GERD (gastroesophageal reflux disease)    Hypertension    Hypoestrogenism    IBS (irritable bowel syndrome)    Migraines    PONV (postoperative nausea and vomiting)     Past Surgical History:  Procedure Laterality Date   ABDOMINAL HYSTERECTOMY  2009   Still has RIGHT ovary   CARPAL TUNNEL RELEASE Left 09/09/2014   Procedure: LEFT CARPAL TUNNEL RELEASE;  Surgeon: Franky Curia, MD;  Location: [facility];  Service: Orthopedics;  Laterality: Left;   COLONOSCOPY     COLONOSCOPY  10/2007   RMR: normal TI, normal colon, internal hemorrhoids   COLONOSCOPY N/A 02/04/2015   RMR: Grade 2-3 hemorrhoid likely source of hematochezia; otherwise normal ileo-colonoscopy.    COLONOSCOPY WITH ESOPHAGOGASTRODUODENOSCOPY (EGD)  2004    RMR: prepyloric ulcer, negative bx, internal hemorrhoids   disc removal 5-6 vert     ESOPHAGOGASTRODUODENOSCOPY     ESOPHAGOGASTRODUODENOSCOPY N/A 02/04/2015   RMR: Erosive reflux esophagitis   SPINAL FUSION     X 2   TOTAL ABDOMINAL HYSTERECTOMY W/ BILATERAL SALPINGOOPHORECTOMY Left    TUBAL LIGATION      Social History   Socioeconomic History   Marital status: Divorced    Spouse name: Not on file   Number of children: Not on file   Years of education: college   Highest education level: Not on file  Occupational History   Occupation: Radio broadcast assistant  Tobacco Use   Smoking status: Never   Smokeless tobacco: Never  Substance and Sexual Activity   Alcohol use: Yes    Alcohol/week: 0.0 standard drinks of alcohol    Comment: very rare social   Drug use: No   Sexual activity: Yes    Partners: Male    Birth control/protection: Surgical  Other Topics Concern   Not on file  Social History Narrative   Twice married,now in 14 year relationship.Lives alone.Works 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   Social Drivers of Health   Financial Resource Strain: Low Risk  (07/18/2021)   Received from [facility]  Overall Financial Resource Strain (CARDIA)    Difficulty of Paying Living Expenses: Not very hard  Food Insecurity: No Food Insecurity (07/18/2021)   Received from [facility]   Hunger Vital Sign    Within the past 12 months, you worried that your food would run out before you got the money to buy more.: Never true    Within the past 12 months, the food you bought just didn't last and you didn't have money to get more.: Never true  Transportation Needs: No Transportation Needs (07/18/2021)   Received from [facility]   PRAPARE - Transportation    Lack of Transportation (Medical): No    Lack of Transportation (Non-Medical): No  Physical Activity: Not on file  Stress: Not on file  Social Connections: Not on file  Intimate  Partner Violence: Not on file    Family History  Problem Relation Age of Onset   Diabetes Mother        Borderline   Hyperlipidemia Mother    Stroke Mother    Heart disease Mother    COPD Mother    Cancer Mother 2       kidney   CAD Mother    Stroke Father    Hyperlipidemia Father    Heart disease Father    COPD Father    Hypertension Father    Colon polyps Father        over age 36   Heart disease Other    Arthritis Other    Lung disease Other    Cancer Other    Asthma Other    Diabetes Other    Kidney disease Other    Crohn's disease Sister    Colon cancer Maternal Grandfather    Lung cancer Other        grandmother   Crohn's disease Other        nephew   Colitis Daughter     Current Outpatient Medications  Medication Sig Dispense Refill   albuterol  (VENTOLIN  HFA) 108 (90 Base) MCG/ACT inhaler Inhale 2 puffs into the lungs every 6 (six) hours as needed for wheezing or shortness of breath. 18 g 3   cholecalciferol (VITAMIN D3) 25 MCG (1000 UNIT) tablet Take 1,000 Units by mouth daily. Gummies     ibuprofen  (ADVIL ) 200 MG tablet Take 200 mg by mouth every 8 (eight) hours as needed for moderate pain.     linaclotide  (LINZESS ) 72 MCG capsule Take 1 capsule (72 mcg total) by mouth daily before breakfast. (Patient not taking: Reported on 02/07/2021) 12 capsule 0   loratadine (CLARITIN REDITABS) 10 MG dissolvable tablet Take 10 mg by mouth daily.     Multiple Vitamins-Minerals (MULTIVITAMIN WITH MINERALS) tablet Take 1 tablet by mouth daily. Gummies     OVER THE COUNTER MEDICATION Place 1 patch onto the skin daily. Thrive Patch (Patient not taking: Reported on 02/23/2022)     OVER THE COUNTER MEDICATION Take 2 capsules by mouth daily. Thrive Capsules (Patient not taking: Reported on 02/23/2022)     polyethylene glycol-electrolytes (NULYTELY) 420 g solution As directed (Patient not taking: Reported on 02/23/2022) 4000 mL 0   vitamin B-12 (CYANOCOBALAMIN) 1000 MCG tablet Take  1,000 mcg by mouth daily. Gummies     No current facility-administered medications for this visit.    Allergies  Allergen Reactions   Phentermine Swelling    Tongue swelling   Tramadol  Swelling    Throat swelling   Codeine Itching   Morphine Itching  Makes skin crawl    Other Itching    cats    REVIEW OF SYSTEMS (negative unless checked):   Cardiac:  []  Chest pain or chest pressure? []  Shortness of breath upon activity? []  Shortness of breath when lying flat? []  Irregular heart rhythm?  Vascular:  []  Pain in calf, thigh, or hip brought on by walking? []  Pain in feet at night that wakes you up from your sleep? []  Blood clot in your veins? [x]  Leg swelling?  Pulmonary:  []  Oxygen at home? []  Productive cough? []  Wheezing?  Neurologic:  []  Sudden weakness in arms or legs? []  Sudden numbness in arms or legs? []  Sudden onset of difficult speaking or slurred speech? []  Temporary loss of vision in one eye? []  Problems with dizziness?  Gastrointestinal:  []  Blood in stool? []  Vomited blood?  Genitourinary:  []  Burning when urinating? []  Blood in urine?  Psychiatric:  []  Major depression  Hematologic:  []  Bleeding problems? []  Problems with blood clotting?  Dermatologic:  []  Rashes or ulcers?  Constitutional:  []  Fever or chills?  Ear/Nose/Throat:  []  Change in hearing? []  Nose bleeds? []  Sore throat?  Musculoskeletal:  []  Back pain? []  Joint pain? []  Muscle pain?   Physical Examination     Vitals:   11/05/23 1431  BP: 136/84  Pulse: 99  Temp: 98.1 F (36.7 C)  TempSrc: Temporal  Weight: 236 lb 6.4 oz (107.2 kg)   Body mass index is 40.58 kg/m.  General:  WDWN in NAD; vital signs documented above Gait: Not observed HENT: WNL, normocephalic Pulmonary: normal non-labored breathing , without Rales, rhonchi,  wheezing Cardiac: Regular Abdomen: soft, NT, no masses Skin: without rashes Vascular Exam/Pulses: 2+ DP pulses  bilaterally Extremities: BLE without varicose veins, with reticular veins, with trace lower leg edema, without stasis pigmentation, without lipodermatosclerosis, without ulcers Musculoskeletal: no muscle wasting or atrophy  Neurologic: A&O X 3;  No focal weakness or paresthesias are detected Psychiatric:  The pt has Normal affect.  Non-invasive Vascular Imaging   RLE Venous Insufficiency Duplex (11/05/2023):  +--------------+---------+------+-----------+------------+--------+  RIGHT        Reflux NoRefluxReflux TimeDiameter cmsComments                          Yes                                   +--------------+---------+------+-----------+------------+--------+  CFV          no                                              +--------------+---------+------+-----------+------------+--------+  FV mid        no                                              +--------------+---------+------+-----------+------------+--------+  Popliteal    no                                              +--------------+---------+------+-----------+------------+--------+  GSV  at [facility]              yes    >500 ms      0.57              +--------------+---------+------+-----------+------------+--------+  GSV prox thighno                            0.49              +--------------+---------+------+-----------+------------+--------+  GSV mid thigh no                            0.60              +--------------+---------+------+-----------+------------+--------+  GSV dist thighno                            0.52              +--------------+---------+------+-----------+------------+--------+  GSV at knee   no                            0.61              +--------------+---------+------+-----------+------------+--------+  GSV prox calf no                            0.37               +--------------+---------+------+-----------+------------+--------+  GSV mid calf  no                            0.26              +--------------+---------+------+-----------+------------+--------+  SSV at [facility]    no                            0.28              +--------------+---------+------+-----------+------------+--------+  SSV prox calf no                            0.23              +--------------+---------+------+-----------+------------+--------+  SSV mid calf            yes    >500 ms      0.21              +--------------+---------+------+-----------+------------+--------+    Medical Decision Making   Mackenzie Gray is a 58 y.o. female who presents for evaluation of painful varicose veins  Based on the patient's duplex, there is reflux in the right greater saphenous vein at the saphenofemoral junction.  The remainder of the patient's greater saphenous vein is competent.  There is also reflux in the small saphenous vein at the mid calf.  There is no evidence of DVT or SVT.  She would not be a candidate for saphenous vein ablation. The patient notes spider and reticular veins in her legs for at least the past 7 years.  Over the past 3 years some of the spider veins at the back of her right knee have gotten larger, itchy, tender, and painful. She is on her feet for most of  the day working in a school [employer].  As the day goes on, her lower legs will get swollen and achy.  Her spider veins will also start causing her burning pain.  She wears knee-high compression stockings, which does help control her leg swelling.  She also tries to elevate her legs in the evening, which does help with her achiness/heaviness On exam the patient has trace edema of bilateral lower legs and ankles.  She has numerous small reticular veins on her legs.  She has a couple of larger spider veins on her posterior right knee.  She has palpable DP pulses bilaterally I have explained to  the patient that she does have evidence of mild venous insufficiency in the right lower extremity, however she is not a candidate for vein ablation.  Her venous insufficiency and prolonged standing throughout the day is likely the cause of her leg swelling and achiness.  This can be treated conservatively with compression stockings, leg elevation, exercise, and weight loss.  I have also explained to the patient that it is common to experience pain in spider veins as the day goes on.  The patient is aware that these veins are not detrimental to her health.  I offered conservative therapy versus sclerotherapy.  She is not interested in sclerotherapy.  She will continue to wear compression stockings and try to elevate her legs when she can.  She was measured for and given a pair of 20 to 30 mmHg thigh-high compression stockings She can follow-up with our office as needed   Mackenzie SHAUNNA Holster, PA-C [facility] Office: [phone]  11/05/2023, 2:24 PM  Clinic MD: Magda

## 2023-11-19 NOTE — Progress Notes (Signed)
 Patient presents today to pick up custom molded foot orthotics, diagnosed with PF by Dr. Tobie.   Orthotics were dispensed and fit was satisfactory. Reviewed instructions for break-in and wear. Written instructions given to patient.  Patient will follow up as needed.   Lolita Schultze CPed
